# Patient Record
Sex: Female | Born: 2000 | Race: White | Hispanic: No | Marital: Single | Smoking: Never smoker
Health system: Southern US, Community
[De-identification: ages and names within clinical notes are randomized; demographics above are authoritative.]

## PROBLEM LIST (undated history)

## (undated) ENCOUNTER — Inpatient Hospital Stay: Payer: Self-pay

## (undated) DIAGNOSIS — J45909 Unspecified asthma, uncomplicated: Secondary | ICD-10-CM

## (undated) DIAGNOSIS — I10 Essential (primary) hypertension: Secondary | ICD-10-CM

## (undated) HISTORY — PX: NO PAST SURGERIES: SHX2092

## (undated) HISTORY — DX: Unspecified asthma, uncomplicated: J45.909

---
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
## 2021-02-11 ENCOUNTER — Other Ambulatory Visit: Payer: Self-pay

## 2021-02-11 ENCOUNTER — Emergency Department
Admission: EM | Admit: 2021-02-11 | Discharge: 2021-02-11 | Disposition: A | Discharge status: Home / Self Care | Payer: Medicaid Other | Attending: Emergency Medicine | Admitting: Emergency Medicine

## 2021-02-11 DIAGNOSIS — L0501 Pilonidal cyst with abscess: Secondary | ICD-10-CM | POA: Diagnosis not present

## 2021-02-11 DIAGNOSIS — S2249XA Multiple fractures of ribs, unspecified side, initial encounter for closed fracture: Secondary | ICD-10-CM

## 2021-02-11 DIAGNOSIS — W19XXXA Unspecified fall, initial encounter: Secondary | ICD-10-CM | POA: Diagnosis not present

## 2021-02-11 DIAGNOSIS — M533 Sacrococcygeal disorders, not elsewhere classified: Secondary | ICD-10-CM | POA: Diagnosis present

## 2021-02-11 MED ORDER — NAPROXEN 375 MG PO TABS: 375.0000 mg | ORAL_TABLET | Freq: Two times a day (BID) | ORAL | 0 refills | Status: DC

## 2021-02-11 MED ORDER — HYDROMORPHONE HCL 1 MG/ML IJ SOLN
1.0000 mg | Freq: Once | INTRAMUSCULAR | Status: AC
  Administered 2021-02-11: 1 mg via INTRAMUSCULAR
  Filled 2021-02-11: qty 1

## 2021-02-11 MED ORDER — SULFAMETHOXAZOLE-TRIMETHOPRIM 800-160 MG PO TABS: 1.0000 | ORAL_TABLET | Freq: Two times a day (BID) | ORAL | 0 refills | Status: DC

## 2021-02-11 MED ORDER — OXYCODONE-ACETAMINOPHEN 7.5-325 MG PO TABS: 1.0000 | ORAL_TABLET | Freq: Four times a day (QID) | ORAL | 0 refills | Status: DC | PRN

## 2021-02-11 MED ORDER — ONDANSETRON 8 MG PO TBDP
8.0000 mg | ORAL_TABLET | Freq: Once | ORAL | Status: AC
  Administered 2021-02-11: 8 mg via ORAL
  Filled 2021-02-11: qty 1

## 2021-02-11 MED ORDER — LIDOCAINE HCL (PF) 1 % IJ SOLN
5.0000 mL | Freq: Once | INTRAMUSCULAR | Status: AC
  Administered 2021-02-11: 5 mL
  Filled 2021-02-11: qty 5

## 2021-02-11 NOTE — Discharge Instructions (Addendum)
Read and follow discharge care instructions.  Take medication as directed.  Return back in 2 days to have packing material removed.  Be advised pain medication may cause drowsiness.

## 2021-02-11 NOTE — ED Notes (Signed)
See triage note  Presents s/p fall  States she landed on tailbone  Ambulates slowly d/t pain

## 2021-02-11 NOTE — ED Triage Notes (Signed)
Pt comes with c/o back and tailbone pain following a mechanical fall.

## 2021-02-11 NOTE — ED Provider Notes (Signed)
[facility] Emergency Department Provider Note   ____________________________________________   Event Date/Time   First MD Initiated Contact with Patient 02/11/21 1058     (approximate)  I have reviewed the triage vital signs and the nursing notes.   HISTORY  Chief Complaint Back Pain    HPI Kathleen Wong is a 20 y.o. female patient presents with 1 week of tailbone pain which increased after mechanical fall 2 days ago.  Patient denies radicular component to her pain.  Patient has bladder bowel dysfunction.  Patient stated feel like there is a "lump" between her buttocks.  Rates the pain as 8/10.  Described pain as "sore".  No palliative measure for complaint.         History reviewed. No pertinent past medical history.  There are no problems to display for this patient.   History reviewed. No pertinent surgical history.  Prior to Admission medications   Medication Sig Start Date End Date Taking? Authorizing Provider  naproxen (NAPROSYN) 375 MG tablet Take 1 tablet (375 mg total) by mouth 2 (two) times daily with a meal. 02/11/21  Yes Joni Reining, PA-C  oxyCODONE-acetaminophen (PERCOCET) 7.5-325 MG tablet Take 1 tablet by mouth every 6 (six) hours as needed for severe pain. 02/11/21  Yes Joni Reining, PA-C  sulfamethoxazole-trimethoprim (BACTRIM DS) 800-160 MG tablet Take 1 tablet by mouth 2 (two) times daily. 02/11/21  Yes Joni Reining, PA-C    Allergies Patient has no known allergies.  No family history on file.  Social History Social History   Tobacco Use  . Smoking status: Never Smoker  . Smokeless tobacco: Never Used  Vaping Use  . Vaping Use: Never used  Substance Use Topics  . Alcohol use: No  . Drug use: No    Review of Systems Constitutional: No fever/chills Eyes: No visual changes. ENT: No sore throat. Cardiovascular: Denies chest pain. Respiratory: Denies shortness of breath. Gastrointestinal: No abdominal  pain.  No nausea, no vomiting.  No diarrhea.  No constipation. Genitourinary: Negative for dysuria. Musculoskeletal: Negative for back pain. Skin: Negative for rash.  Nodule lesion between buttocks. Neurological: Negative for headaches, focal weakness or numbness.   ____________________________________________   PHYSICAL EXAM:  VITAL SIGNS: ED Triage Vitals  Enc Vitals Group     BP 02/11/21 1046 137/62     Pulse Rate 02/11/21 1046 74     Resp 02/11/21 1046 16     Temp 02/11/21 1046 98.3 F (36.8 C)     Temp Source 02/11/21 1046 Oral     SpO2 02/11/21 1046 100 %     Weight 02/11/21 1029 135 lb (61.2 kg)     Height 02/11/21 1029 5\' 5"  (1.651 m)     Head Circumference --      Peak Flow --      Pain Score 02/11/21 1029 8     Pain Loc --      Pain Edu? --      Excl. in GC? --    Constitutional: Alert and oriented. Well appearing and in no acute distress.  Anxious Cardiovascular: Normal rate, regular rhythm. Grossly normal heart sounds.  Good peripheral circulation. Respiratory: Normal respiratory effort.  No retractions. Lungs CTAB. Genitourinary: Deferred Musculoskeletal: No lower extremity tenderness nor edema.  No joint effusions. Neurologic:  Normal speech and language. No gross focal neurologic deficits are appreciated. No gait instability. Skin: Fluctuant nodule lesion superior aspect of buttocks  psychiatric: Mood and affect are normal.  Speech and behavior are normal.  ____________________________________________   LABS (all labs ordered are listed, but only abnormal results are displayed)  Labs Reviewed - No data to display ____________________________________________  EKG   ____________________________________________  RADIOLOGY I, Joni Reining, personally viewed and evaluated these images (plain radiographs) as part of my medical decision making, as well as reviewing the written report by the radiologist.  ED MD interpretation:  Official radiology  report(s): No results found.  ____________________________________________   PROCEDURES  Procedure(s) performed (including Critical Care):  Marland KitchenMarland KitchenIncision and Drainage  Date/Time: 02/11/2021 11:53 AM Performed by: Joni Reining, PA-C Authorized by: Joni Reining, PA-C   Consent:    Consent obtained:  Verbal   Consent given by:  Patient and parent   Risks, benefits, and alternatives were discussed: yes     Risks discussed:  Bleeding, incomplete drainage, pain and infection Universal protocol:    Procedure explained and questions answered to patient or proxy's satisfaction: yes     Relevant documents present and verified: yes     Patient identity confirmed:  Verbally with patient Anesthesia:    Anesthesia method:  Local infiltration   Local anesthetic:  Lidocaine 1% w/o epi Procedure type:    Complexity:  Complex Procedure details:    Incision types:  Stab incision and single with marsupialization   Incision depth:  Dermal   Wound management:  Probed and deloculated and irrigated with saline   Drainage:  Purulent   Drainage amount:  Copious   Wound treatment:  Drain placed   Packing materials:  1/4 in iodoform gauze   Amount 1/4" iodoform:  10 cm Post-procedure details:    Procedure completion:  Tolerated well, no immediate complications     ____________________________________________   INITIAL IMPRESSION / ASSESSMENT AND PLAN / ED COURSE  As part of my medical decision making, I reviewed the following data within the electronic MEDICAL RECORD NUMBER         Patient presents with buttocks pain for nodule lesion superior aspect between the buttocks.  Patient complaint physical exam consistent with pilonidal cyst.  See procedure note for incision and drainage.  Patient given discharge care instruction advised take medication as directed.  Patient advised follow-up in 2 days for wound check.      ____________________________________________   FINAL CLINICAL  IMPRESSION(S) / ED DIAGNOSES  Final diagnoses:  Pilonidal cyst with abscess     ED Discharge Orders         Ordered    oxyCODONE-acetaminophen (PERCOCET) 7.5-325 MG tablet  Every 6 hours PRN        02/11/21 1149    sulfamethoxazole-trimethoprim (BACTRIM DS) 800-160 MG tablet  2 times daily        02/11/21 1149    naproxen (NAPROSYN) 375 MG tablet  2 times daily with meals        02/11/21 1149          *Please note:  Kathleen Wong was evaluated in Emergency Department on 02/11/2021 for the symptoms described in the history of present illness. She was evaluated in the context of the global COVID-19 pandemic, which necessitated consideration that the patient might be at risk for infection with the SARS-CoV-2 virus that causes COVID-19. Institutional protocols and algorithms that pertain to the evaluation of patients at risk for COVID-19 are in a state of rapid change based on information released by regulatory bodies including the CDC and federal and state organizations. These policies and algorithms were followed during  the patient's care in the ED.  Some ED evaluations and interventions may be delayed as a result of limited staffing during and the pandemic.*   Note:  This document was prepared using Dragon voice recognition software and may include unintentional dictation errors.    Joni Reining, PA-C 02/11/21 1158    Merwyn Katos, MD 02/11/21 [phone]

## 2021-02-13 ENCOUNTER — Encounter: Payer: Self-pay | Admitting: Emergency Medicine

## 2021-02-13 ENCOUNTER — Other Ambulatory Visit: Payer: Self-pay

## 2021-02-13 ENCOUNTER — Emergency Department
Admission: EM | Admit: 2021-02-13 | Discharge: 2021-02-13 | Disposition: A | Discharge status: Home / Self Care | Payer: Medicaid Other | Attending: Emergency Medicine | Admitting: Emergency Medicine

## 2021-02-13 DIAGNOSIS — Z4801 Encounter for change or removal of surgical wound dressing: Secondary | ICD-10-CM | POA: Diagnosis not present

## 2021-02-13 DIAGNOSIS — Z5189 Encounter for other specified aftercare: Secondary | ICD-10-CM

## 2021-02-13 NOTE — ED Triage Notes (Signed)
Was seen 2 days ago for pilonidal cyst   States she is here for packing removal

## 2021-02-13 NOTE — Discharge Instructions (Signed)
Follow discharge care instruction continue previous medication.  You may now take sitz baths.

## 2021-02-13 NOTE — ED Provider Notes (Signed)
[facility] Emergency Department Provider Note   ____________________________________________   Event Date/Time   First MD Initiated Contact with Patient 02/13/21 1302     (approximate)  I have reviewed the triage vital signs and the nursing notes.   HISTORY  Chief Complaint Wound Check (/)    HPI Kathleen Wong is a 20 y.o. female patient follow-up status post incision and drainage pilonidal cyst 2 days ago.  Patient state feeling better.  Patient denies able to sit without discomfort.  Patient has purchased a doughnut cushion as directed.         History reviewed. No pertinent past medical history.  There are no problems to display for this patient.   History reviewed. No pertinent surgical history.  Prior to Admission medications   Medication Sig Start Date End Date Taking? Authorizing Provider  naproxen (NAPROSYN) 375 MG tablet Take 1 tablet (375 mg total) by mouth 2 (two) times daily with a meal. 02/11/21   Joni Reining, PA-C  oxyCODONE-acetaminophen (PERCOCET) 7.5-325 MG tablet Take 1 tablet by mouth every 6 (six) hours as needed for severe pain. 02/11/21   Joni Reining, PA-C  sulfamethoxazole-trimethoprim (BACTRIM DS) 800-160 MG tablet Take 1 tablet by mouth 2 (two) times daily. 02/11/21   Joni Reining, PA-C    Allergies Patient has no known allergies.  No family history on file.  Social History Social History   Tobacco Use   Smoking status: Never Smoker   Smokeless tobacco: Never Used  Building services engineer Use: Never used  Substance Use Topics   Alcohol use: No   Drug use: No    Review of Systems Constitutional: No fever/chills Eyes: No visual changes. ENT: No sore throat. Cardiovascular: Denies chest pain. Respiratory: Denies shortness of breath. Gastrointestinal: No abdominal pain.  No nausea, no vomiting.  No diarrhea.  No constipation. Genitourinary: Negative for dysuria. Musculoskeletal: Negative for  back pain. Skin: Negative for rash.  Pilonidal cyst Neurological: Negative for headaches, focal weakness or numbness.   ____________________________________________   PHYSICAL EXAM:  VITAL SIGNS: ED Triage Vitals  Enc Vitals Group     BP 02/13/21 1241 130/78     Pulse Rate 02/13/21 1241 78     Resp 02/13/21 1241 18     Temp 02/13/21 1241 98.4 F (36.9 C)     Temp Source 02/13/21 1241 Oral     SpO2 02/13/21 1241 99 %     Weight 02/13/21 1242 134 lb 14.7 oz (61.2 kg)     Height 02/13/21 1242 5\' 5"  (1.651 m)     Head Circumference --      Peak Flow --      Pain Score 02/13/21 1242 2     Pain Loc --      Pain Edu? --      Excl. in GC? --     Constitutional: Alert and oriented. Well appearing and in no acute distress. Cardiovascular: Normal rate, regular rhythm. Grossly normal heart sounds.  Good peripheral circulation. Respiratory: Normal respiratory effort.  No retractions. Lungs CTAB. Genitourinary: Deferred Musculoskeletal: No lower extremity tenderness nor edema.  No joint effusions. Neurologic:  Normal speech and language. No gross focal neurologic deficits are appreciated. No gait instability. Skin: Slight drainage from incision site.  Psychiatric: Mood and affect are normal. Speech and behavior are normal.  ____________________________________________   LABS (all labs ordered are listed, but only abnormal results are displayed)  Labs Reviewed - No data  to display ____________________________________________  EKG   ____________________________________________  RADIOLOGY I, Joni Reining, personally viewed and evaluated these images (plain radiographs) as part of my medical decision making, as well as reviewing the written report by the radiologist.  ED MD interpretation:    Official radiology report(s): No results found.  ____________________________________________   PROCEDURES  Procedure(s) performed (including Critical  Care):  Procedures   ____________________________________________   INITIAL IMPRESSION / ASSESSMENT AND PLAN / ED COURSE  As part of my medical decision making, I reviewed the following data within the electronic MEDICAL RECORD NUMBER         Patient presents for wound check status post incision and drainage of a pilonidal cyst.  Packing material was removed with minimal drainage.  Wound was irrigated with clear return.  Area was clean and redressed.  Patient advised continue previous medications and follow discharge care instruction.  Return to ED if condition worsens.      ____________________________________________   FINAL CLINICAL IMPRESSION(S) / ED DIAGNOSES  Final diagnoses:  Wound check, abscess     ED Discharge Orders    None      *Please note:  Kathleen Wong was evaluated in Emergency Department on 02/13/2021 for the symptoms described in the history of present illness. She was evaluated in the context of the global COVID-19 pandemic, which necessitated consideration that the patient might be at risk for infection with the SARS-CoV-2 virus that causes COVID-19. Institutional protocols and algorithms that pertain to the evaluation of patients at risk for COVID-19 are in a state of rapid change based on information released by regulatory bodies including the CDC and federal and state organizations. These policies and algorithms were followed during the patient's care in the ED.  Some ED evaluations and interventions may be delayed as a result of limited staffing during and the pandemic.*   Note:  This document was prepared using Dragon voice recognition software and may include unintentional dictation errors.    Joni Reining, PA-C 02/13/21 1316    Concha Se, MD 02/14/21 [phone]

## 2021-02-14 ENCOUNTER — Ambulatory Visit: Payer: Self-pay

## 2023-06-01 ENCOUNTER — Other Ambulatory Visit: Payer: Self-pay

## 2023-06-01 ENCOUNTER — Emergency Department
Admission: EM | Admit: 2023-06-01 | Discharge: 2023-06-01 | Disposition: A | Discharge status: Home / Self Care | Payer: Medicaid Other | Attending: Emergency Medicine | Admitting: Emergency Medicine

## 2023-06-01 DIAGNOSIS — Z3202 Encounter for pregnancy test, result negative: Secondary | ICD-10-CM | POA: Diagnosis not present

## 2023-06-01 DIAGNOSIS — Z3200 Encounter for pregnancy test, result unknown: Secondary | ICD-10-CM | POA: Diagnosis present

## 2023-06-01 LAB — URINALYSIS, ROUTINE W REFLEX MICROSCOPIC
Bilirubin Urine: NEGATIVE
Glucose, UA: NEGATIVE mg/dL
Hgb urine dipstick: NEGATIVE
Ketones, ur: NEGATIVE mg/dL
Nitrite: NEGATIVE
Protein, ur: 30 mg/dL — AB
Specific Gravity, Urine: 1.028 (ref 1.005–1.030)
pH: 6 (ref 5.0–8.0)

## 2023-06-01 LAB — POC URINE PREG, ED: Preg Test, Ur: NEGATIVE

## 2023-06-01 NOTE — ED Triage Notes (Signed)
Pt presents to ER with c/o nausea and very mild lower abd cramping that started a few days ago.  Pt states she is concerned she is pregnant at this time.  LMP 6/22, no prior pregnancies.  Pt denies vomiting, diarrhea, vag bleeding, or urinary sx.  Pt is otherwise A&O x4 and in NAD.

## 2023-06-01 NOTE — ED Provider Notes (Signed)
   [facility] Provider Note    Event Date/Time   First MD Initiated Contact with Patient 06/01/23 [phone]     (approximate)   History   Possible Pregnancy   HPI  Kathleen Wong is a 22 y.o. female who presents to the ED for evaluation of Possible Pregnancy   Patient presents to the ED requesting pregnancy test.  Reports she felt nauseous this morning, since resolved.  Reports that she is terrified she might be pregnant.  No other concerns.  Did not try an OTC pregnancy test.   Physical Exam   Triage Vital Signs: ED Triage Vitals  Enc Vitals Group     BP 06/01/23 0415 (!) 154/91     Pulse Rate 06/01/23 0415 78     Resp 06/01/23 0415 18     Temp 06/01/23 0415 (!) 97.5 F (36.4 C)     Temp Source 06/01/23 0415 Oral     SpO2 06/01/23 0415 100 %     Weight 06/01/23 0414 180 lb (81.6 kg)     Height 06/01/23 0414 5\' 5"  (1.651 m)     Head Circumference --      Peak Flow --      Pain Score 06/01/23 0423 3     Pain Loc --      Pain Edu? --      Excl. in GC? --     Most recent vital signs: Vitals:   06/01/23 0415  BP: (!) 154/91  Pulse: 78  Resp: 18  Temp: (!) 97.5 F (36.4 C)  SpO2: 100%    General: Awake, no distress.  CV:  Good peripheral perfusion.  Resp:  Normal effort.  Abd:  No distention.  Soft and benign MSK:  No deformity noted.  Neuro:  No focal deficits appreciated. Other:     ED Results / Procedures / Treatments   Labs (all labs ordered are listed, but only abnormal results are displayed) Labs Reviewed  URINALYSIS, ROUTINE W REFLEX MICROSCOPIC - Abnormal; Notable for the following components:      Result Value   Color, Urine YELLOW (*)    APPearance CLOUDY (*)    Protein, ur 30 (*)    Leukocytes,Ua SMALL (*)    Bacteria, UA RARE (*)    All other components within normal limits  POC URINE PREG, ED    EKG   RADIOLOGY   Official radiology report(s): No results found.  PROCEDURES and  INTERVENTIONS:  Procedures  Medications - No data to display   IMPRESSION / MDM / ASSESSMENT AND PLAN / ED COURSE  I reviewed the triage vital signs and the nursing notes.  Differential diagnosis includes, but is not limited to, pregnant, not pregnant, UTI  Patient presents for pregnancy test.  Not pregnant.  Benign exam and suitable for outpatient management.     FINAL CLINICAL IMPRESSION(S) / ED DIAGNOSES   Final diagnoses:  Pregnancy examination or test, negative result     Rx / DC Orders   ED Discharge Orders     None        Note:  This document was prepared using Dragon voice recognition software and may include unintentional dictation errors.   Delton Prairie, MD 06/01/23 [phone]

## 2023-08-09 ENCOUNTER — Ambulatory Visit (LOCAL_COMMUNITY_HEALTH_CENTER): Payer: Medicaid Other

## 2023-08-09 VITALS — BP 134/74 | Ht 65.0 in | Wt 226.0 lb

## 2023-08-09 DIAGNOSIS — Z3201 Encounter for pregnancy test, result positive: Secondary | ICD-10-CM

## 2023-08-09 DIAGNOSIS — Z3009 Encounter for other general counseling and advice on contraception: Secondary | ICD-10-CM

## 2023-08-09 LAB — PREGNANCY, URINE: Preg Test, Ur: POSITIVE — AB

## 2023-08-09 MED ORDER — PRENATAL 27-0.8 MG PO TABS: 1.0000 | ORAL_TABLET | Freq: Every day | ORAL | Status: AC

## 2023-08-09 NOTE — Progress Notes (Signed)
UPT positive. Plans prenatal care at [facility]. Has contacted them but no appt yet. Encouraged to establish prenatal care soon.  Positive preg packet given and reviewed.   The patient was dispensed prenatal vitamins #100 today per [facility] Dr Lorrin Mais. I provided counseling today regarding the medication. We discussed the medication, the side effects and when to call clinic. Patient given the opportunity to ask questions. Questions answered.    Sent to clerk for medicaid presumptive eligibility. Jerel Shepherd, RN

## 2023-08-17 ENCOUNTER — Ambulatory Visit (INDEPENDENT_AMBULATORY_CARE_PROVIDER_SITE_OTHER): Payer: Self-pay

## 2023-08-17 VITALS — Wt 226.0 lb

## 2023-08-17 DIAGNOSIS — Z369 Encounter for antenatal screening, unspecified: Secondary | ICD-10-CM

## 2023-08-17 DIAGNOSIS — Z3480 Encounter for supervision of other normal pregnancy, unspecified trimester: Secondary | ICD-10-CM | POA: Insufficient documentation

## 2023-08-17 DIAGNOSIS — O0990 Supervision of high risk pregnancy, unspecified, unspecified trimester: Secondary | ICD-10-CM | POA: Insufficient documentation

## 2023-08-17 DIAGNOSIS — Z3689 Encounter for other specified antenatal screening: Secondary | ICD-10-CM

## 2023-08-17 NOTE — Progress Notes (Signed)
Pt is scheduled °

## 2023-08-17 NOTE — Patient Instructions (Signed)
First Trimester of Pregnancy  The first trimester of pregnancy starts on the first day of your last menstrual period until the end of week 12. This is also called months 1 through 3 of pregnancy. Body changes during your first trimester Your body goes through many changes during pregnancy. The changes usually return to normal after your baby is born. Physical changes You may gain or lose weight. Your breasts may grow larger and hurt. The area around your nipples may get darker. Dark spots or blotches may develop on your face. You may have changes in your hair. Health changes You may feel like you might vomit (nauseous), and you may vomit. You may have heartburn. You may have headaches. You may have trouble pooping (constipation). Your gums may bleed. Other changes You may get tired easily. You may pee (urinate) more often. Your menstrual periods will stop. You may not feel hungry. You may want to eat certain kinds of food. You may have changes in your emotions from day to day. You may have more dreams. Follow these instructions at home: Medicines Take over-the-counter and prescription medicines only as told by your doctor. Some medicines are not safe during pregnancy. Take a prenatal vitamin that contains at least 600 micrograms (mcg) of folic acid. Eating and drinking Eat healthy meals that include: Fresh fruits and vegetables. Whole grains. Good sources of protein, such as meat, eggs, or tofu. Low-fat dairy products. Avoid raw meat and unpasteurized juice, milk, and cheese. If you feel like you may vomit, or you vomit: Eat 4 or 5 small meals a day instead of 3 large meals. Try eating a few soda crackers. Drink liquids between meals instead of during meals. You may need to take these actions to prevent or treat trouble pooping: Drink enough fluids to keep your pee (urine) pale yellow. Eat foods that are high in fiber. These include beans, whole grains, and fresh fruits and  vegetables. Limit foods that are high in fat and sugar. These include fried or sweet foods. Activity Exercise only as told by your doctor. Most people can do their usual exercise routine during pregnancy. Stop exercising if you have cramps or pain in your lower belly (abdomen) or low back. Do not exercise if it is too hot or too humid, or if you are in a place of great height (high altitude). Avoid heavy lifting. If you choose to, you may have sex unless your doctor tells you not to. Relieving pain and discomfort Wear a good support bra if your breasts are sore. Rest with your legs raised (elevated) if you have leg cramps or low back pain. If you have bulging veins (varicose veins) in your legs: Wear support hose as told by your doctor. Raise your feet for 15 minutes, 3-4 times a day. Limit salt in your food. Safety Wear your seat belt at all times when you are in a car. Talk with your doctor if someone is hurting you or yelling at you. Talk with your doctor if you are feeling sad or have thoughts of hurting yourself. Lifestyle Do not use hot tubs, steam rooms, or saunas. Do not douche. Do not use tampons or scented sanitary pads. Do not use herbal medicines, illegal drugs, or medicines that are not approved by your doctor. Do not drink alcohol. Do not smoke or use any products that contain nicotine or tobacco. If you need help quitting, ask your doctor. Avoid cat litter boxes and soil that is used by cats. These carry   germs that can cause harm to the baby and can cause a loss of your baby by miscarriage or stillbirth. General instructions Keep all follow-up visits. This is important. Ask for help if you need counseling or if you need help with nutrition. Your doctor can give you advice or tell you where to go for help. Visit your dentist. At home, brush your teeth with a soft toothbrush. Floss gently. Write down your questions. Take them to your prenatal visits. Where to find more  information American Pregnancy Association: americanpregnancy.org American College of Obstetricians and Gynecologists: www.acog.org Office on Women's Health: womenshealth.gov/pregnancy Contact a doctor if: You are dizzy. You have a fever. You have mild cramps or pressure in your lower belly. You have a nagging pain in your belly area. You continue to feel like you may vomit, you vomit, or you have watery poop (diarrhea) for 24 hours or longer. You have a bad-smelling fluid coming from your vagina. You have pain when you pee. You are exposed to a disease that spreads from person to person, such as chickenpox, measles, Zika virus, HIV, or hepatitis. Get help right away if: You have spotting or bleeding from your vagina. You have very bad belly cramping or pain. You have shortness of breath or chest pain. You have any kind of injury, such as from a fall or a car crash. You have new or increased pain, swelling, or redness in an arm or leg. Summary The first trimester of pregnancy starts on the first day of your last menstrual period until the end of week 12 (months 1 through 3). Eat 4 or 5 small meals a day instead of 3 large meals. Do not smoke or use any products that contain nicotine or tobacco. If you need help quitting, ask your doctor. Keep all follow-up visits. This information is not intended to replace advice given to you by your health care provider. Make sure you discuss any questions you have with your health care provider. Document Revised: 04/17/2020 Document Reviewed: 02/22/2020 Elsevier Patient Education  2024 Elsevier Inc. Commonly Asked Questions During Pregnancy  Cats: A parasite can be excreted in cat feces.  To avoid exposure you need to have another person empty the little box.  If you must empty the litter box you will need to wear gloves.  Wash your hands after handling your cat.  This parasite can also be found in raw or undercooked meat so this should also be  avoided.  Colds, Sore Throats, Flu: Please check your medication sheet to see what you can take for symptoms.  If your symptoms are unrelieved by these medications please call the office.  Dental Work: Most any dental work your dentist recommends is permitted.  X-rays should only be taken during the first trimester if absolutely necessary.  Your abdomen should be shielded with a lead apron during all x-rays.  Please notify your provider prior to receiving any x-rays.  Novocaine is fine; gas is not recommended.  If your dentist requires a note from us prior to dental work please call the office and we will provide one for you.  Exercise: Exercise is an important part of staying healthy during your pregnancy.  You may continue most exercises you were accustomed to prior to pregnancy.  Later in your pregnancy you will most likely notice you have difficulty with activities requiring balance like riding a bicycle.  It is important that you listen to your body and avoid activities that put you at a higher   risk of falling.  Adequate rest and staying well hydrated are a must!  If you have questions about the safety of specific activities ask your provider.    Exposure to Children with illness: Try to avoid obvious exposure; report any symptoms to us when noted,  If you have chicken pos, red measles or mumps, you should be immune to these diseases.   Please do not take any vaccines while pregnant unless you have checked with your OB provider.  Fetal Movement: After 28 weeks we recommend you do "kick counts" twice daily.  Lie or sit down in a calm quiet environment and count your baby movements "kicks".  You should feel your baby at least 10 times per hour.  If you have not felt 10 kicks within the first hour get up, walk around and have something sweet to eat or drink then repeat for an additional hour.  If count remains less than 10 per hour notify your provider.  Fumigating: Follow your pest control agent's  advice as to how long to stay out of your home.  Ventilate the area well before re-entering.  Hemorrhoids:   Most over-the-counter preparations can be used during pregnancy.  Check your medication to see what is safe to use.  It is important to use a stool softener or fiber in your diet and to drink lots of liquids.  If hemorrhoids seem to be getting worse please call the office.   Hot Tubs:  Hot tubs Jacuzzis and saunas are not recommended while pregnant.  These increase your internal body temperature and should be avoided.  Intercourse:  Sexual intercourse is safe during pregnancy as long as you are comfortable, unless otherwise advised by your provider.  Spotting may occur after intercourse; report any bright red bleeding that is heavier than spotting.  Labor:  If you know that you are in labor, please go to the hospital.  If you are unsure, please call the office and let us help you decide what to do.  Lifting, straining, etc:  If your job requires heavy lifting or straining please check with your provider for any limitations.  Generally, you should not lift items heavier than that you can lift simply with your hands and arms (no back muscles)  Painting:  Paint fumes do not harm your pregnancy, but may make you ill and should be avoided if possible.  Latex or water based paints have less odor than oils.  Use adequate ventilation while painting.  Permanents & Hair Color:  Chemicals in hair dyes are not recommended as they cause increase hair dryness which can increase hair loss during pregnancy.  " Highlighting" and permanents are allowed.  Dye may be absorbed differently and permanents may not hold as well during pregnancy.  Sunbathing:  Use a sunscreen, as skin burns easily during pregnancy.  Drink plenty of fluids; avoid over heating.  Tanning Beds:  Because their possible side effects are still unknown, tanning beds are not recommended.  Ultrasound Scans:  Routine ultrasounds are performed  at approximately 20 weeks.  You will be able to see your baby's general anatomy an if you would like to know the gender this can usually be determined as well.  If it is questionable when you conceived you may also receive an ultrasound early in your pregnancy for dating purposes.  Otherwise ultrasound exams are not routinely performed unless there is a medical necessity.  Although you can request a scan we ask that you pay for it when   conducted because insurance does not cover " patient request" scans.  Work: If your pregnancy proceeds without complications you may work until your due date, unless your physician or employer advises otherwise.  Round Ligament Pain/Pelvic Discomfort:  Sharp, shooting pains not associated with bleeding are fairly common, usually occurring in the second trimester of pregnancy.  They tend to be worse when standing up or when you remain standing for long periods of time.  These are the result of pressure of certain pelvic ligaments called "round ligaments".  Rest, Tylenol and heat seem to be the most effective relief.  As the womb and fetus grow, they rise out of the pelvis and the discomfort improves.  Please notify the office if your pain seems different than that described.  It may represent a more serious condition.  Common Medications Safe in Pregnancy  Acne:      Constipation:  Benzoyl Peroxide     Colace  Clindamycin      Dulcolax Suppository  Topica Erythromycin     Fibercon  Salicylic Acid      Metamucil         Miralax AVOID:        Senakot   Accutane    Cough:  Retin-A       Cough Drops  Tetracycline      Phenergan w/ Codeine if Rx  Minocycline      Robitussin (Plain & DM)  Antibiotics:     Crabs/Lice:  Ceclor       RID  Cephalosporins    AVOID:  E-Mycins      Kwell  Keflex  Macrobid/Macrodantin   Diarrhea:  Penicillin      Kao-Pectate  Zithromax      Imodium AD         PUSH FLUIDS AVOID:       Cipro     Fever:  Tetracycline      Tylenol (Regular  or Extra  Minocycline       Strength)  Levaquin      Extra Strength-Do not          Exceed 8 tabs/24 hrs Caffeine:        <200mg/day (equiv. To 1 cup of coffee or  approx. 3 12 oz sodas)         Gas: Cold/Hayfever:       Gas-X  Benadryl      Mylicon  Claritin       Phazyme  **Claritin-D        Chlor-Trimeton    Headaches:  Dimetapp      ASA-Free Excedrin  Drixoral-Non-Drowsy     Cold Compress  Mucinex (Guaifenasin)     Tylenol (Regular or Extra  Sudafed/Sudafed-12 Hour     Strength)  **Sudafed PE Pseudoephedrine   Tylenol Cold & Sinus     Vicks Vapor Rub  Zyrtec  **AVOID if Problems With Blood Pressure         Heartburn: Avoid lying down for at least 1 hour after meals  Aciphex      Maalox     Rash:  Milk of Magnesia     Benadryl    Mylanta       1% Hydrocortisone Cream  Pepcid  Pepcid Complete   Sleep Aids:  Prevacid      Ambien   Prilosec       Benadryl  Rolaids       Chamomile Tea  Tums (Limit 4/day)     Unisom           Tylenol PM         Warm milk-add vanilla or  Hemorrhoids:       Sugar for taste  Anusol/Anusol H.C.  (RX: Analapram 2.5%)  Sugar Substitutes:  Hydrocortisone OTC     Ok in moderation  Preparation H      Tucks        Vaseline lotion applied to tissue with wiping    Herpes:     Throat:  Acyclovir      Oragel  Famvir  Valtrex     Vaccines:         Flu Shot Leg Cramps:       *Gardasil  Benadryl      Hepatitis A         Hepatitis B Nasal Spray:       Pneumovax  Saline Nasal Spray     Polio Booster         Tetanus Nausea:       Tuberculosis test or PPD  Vitamin B6 25 mg TID   AVOID:    Dramamine      *Gardasil  Emetrol       Live Poliovirus  Ginger Root 250 mg QID    MMR (measles, mumps &  High Complex Carbs @ Bedtime    rebella)  Sea Bands-Accupressure    Varicella (Chickenpox)  Unisom 1/2 tab TID     *No known complications           If received before Pain:         Known pregnancy;   Darvocet       Resume series  after  Lortab        Delivery  Percocet    Yeast:   Tramadol      Femstat  Tylenol 3      Gyne-lotrimin  Ultram       Monistat  Vicodin           MISC:         All Sunscreens           Hair Coloring/highlights          Insect Repellant's          (Including DEET)         Mystic Tans  

## 2023-08-17 NOTE — Progress Notes (Addendum)
New OB Intake  I connected with  Flint Melter on 08/17/23 at  9:15 AM EDT by telephone Video Visit and verified that I am speaking with the correct person using two identifiers. Nurse is located at [facility] and pt is located at home.  I explained I am completing New OB Intake today. We discussed her EDD of 03/30/2024 that is based on LMP of 06/24/2023. Pt is G1/P0. I reviewed her allergies, medications, Medical/Surgical/OB history, and appropriate screenings. There are cats in the home. Pt states when she cleans litter box she covers her nose.  Adv to not clean the litter    Based on history, this is a/an pregnancy uncomplicated .   Patient Active Problem List   Diagnosis Date Noted   Supervision of other normal pregnancy, antepartum 08/17/2023    Concerns addressed today None   Delivery Plans:  Plans to deliver at [facility]. Pt isn't sure; pt aware [facility] is the only hospital our providers deliver at; if she goes somewhere else she will have someone deliver her that she hasn't seen before.  Anatomy US Explained first scheduled [place] will be scheduled soon and an anatomy scan will be done at 20 weeks.    Labs Discussed Avelina Laine genetic screening with patient. Patient generic genetic testing to be drawn at new OB visit. Discussed possible labs to be drawn at new OB appointment.  COVID Vaccine Patient has not had COVID vaccine.   Social Determinants of Health Food Insecurity: denies food insecurity Transportation: Patient denies transportation needs.  First visit review I reviewed new OB appt with pt. I explained she will have ob bloodwork and pap smear/pelvic exam if indicated. Explained pt will be seen by Guadlupe Spanish, LP at first visit; encounter routed to appropriate provider.   Loran Senters, [facility] 08/17/2023  9:49 AM

## 2023-08-18 NOTE — Progress Notes (Signed)
Patient has been scheduled for 08/27/2023 for a dating scan  cj

## 2023-08-27 ENCOUNTER — Ambulatory Visit (INDEPENDENT_AMBULATORY_CARE_PROVIDER_SITE_OTHER): Payer: Medicaid Other

## 2023-08-27 DIAGNOSIS — Z3A01 Less than 8 weeks gestation of pregnancy: Secondary | ICD-10-CM | POA: Diagnosis not present

## 2023-08-27 DIAGNOSIS — Z3687 Encounter for antenatal screening for uncertain dates: Secondary | ICD-10-CM | POA: Diagnosis not present

## 2023-08-27 DIAGNOSIS — Z3480 Encounter for supervision of other normal pregnancy, unspecified trimester: Secondary | ICD-10-CM

## 2023-08-27 DIAGNOSIS — Z369 Encounter for antenatal screening, unspecified: Secondary | ICD-10-CM

## 2023-09-11 ENCOUNTER — Other Ambulatory Visit: Payer: Self-pay | Admitting: Obstetrics

## 2023-09-11 ENCOUNTER — Encounter: Payer: Self-pay | Admitting: Obstetrics

## 2023-09-11 NOTE — Progress Notes (Signed)
EDD changed to 04/16/24 per [place] 08/27/23 that shows [redacted]w[redacted]d IUP.  Glenetta Borg, CNM

## 2023-10-08 ENCOUNTER — Ambulatory Visit (INDEPENDENT_AMBULATORY_CARE_PROVIDER_SITE_OTHER): Payer: Medicaid Other | Admitting: Obstetrics

## 2023-10-08 ENCOUNTER — Encounter: Payer: Self-pay | Admitting: Obstetrics

## 2023-10-08 ENCOUNTER — Other Ambulatory Visit (HOSPITAL_COMMUNITY)
Admission: RE | Admit: 2023-10-08 | Discharge: 2023-10-08 | Disposition: A | Discharge status: Home / Self Care | Payer: Medicaid Other | Source: Ambulatory Visit | Attending: Certified Nurse Midwife | Admitting: Certified Nurse Midwife

## 2023-10-08 VITALS — BP 143/88 | HR 88 | Wt 228.0 lb

## 2023-10-08 DIAGNOSIS — Z124 Encounter for screening for malignant neoplasm of cervix: Secondary | ICD-10-CM | POA: Insufficient documentation

## 2023-10-08 DIAGNOSIS — Z3A12 12 weeks gestation of pregnancy: Secondary | ICD-10-CM | POA: Diagnosis not present

## 2023-10-08 DIAGNOSIS — Z1379 Encounter for other screening for genetic and chromosomal anomalies: Secondary | ICD-10-CM

## 2023-10-08 DIAGNOSIS — Z3401 Encounter for supervision of normal first pregnancy, first trimester: Secondary | ICD-10-CM | POA: Diagnosis not present

## 2023-10-08 DIAGNOSIS — Z0283 Encounter for blood-alcohol and blood-drug test: Secondary | ICD-10-CM

## 2023-10-08 DIAGNOSIS — O161 Unspecified maternal hypertension, first trimester: Secondary | ICD-10-CM

## 2023-10-08 DIAGNOSIS — Z113 Encounter for screening for infections with a predominantly sexual mode of transmission: Secondary | ICD-10-CM

## 2023-10-08 DIAGNOSIS — R030 Elevated blood-pressure reading, without diagnosis of hypertension: Secondary | ICD-10-CM | POA: Insufficient documentation

## 2023-10-08 LAB — OB RESULTS CONSOLE VARICELLA ZOSTER ANTIBODY, IGG: Varicella: NON-IMMUNE/NOT IMMUNE

## 2023-10-08 MED ORDER — ASPIRIN 81 MG PO TBEC: 162.0000 mg | DELAYED_RELEASE_TABLET | Freq: Every day | ORAL | 12 refills | Status: DC

## 2023-10-08 NOTE — Progress Notes (Signed)
NEW OB HISTORY AND PHYSICAL  SUBJECTIVE:       Kathleen Wong is a 22 y.o. G31P0000 female, Patient's last menstrual period was 06/24/2023 (within days)., Estimated Date of Delivery: 04/16/24, [redacted]w[redacted]d, presents today for establishment of Prenatal Care. She reports occasional nausea and headaches that have resolved. The father of the  baby had a sister who died of SIDS. His mother reports that there have been 5 SIDS deaths in their family.   Social history Partner/Relationship: Evangeline Gula, involved Living situation: Lives with Onalee Hua and his family (mother, brother, 11-year-old daughter) Work: [employer] Exercise: none outside of work Substance use: quit vaping when she found out she was pregnant; denies other substance use   Gynecologic History Patient's last menstrual period was 06/24/2023 (within days).  Last period was lighter than normal Contraception: none Last Pap: Never.   Obstetric History OB History  Gravida Para Term Preterm AB Living  1 0 0 0 0 0  SAB IAB Ectopic Multiple Live Births  0 0 0 0 0    # Outcome Date GA Lbr Len/2nd Weight Sex Type Anes PTL Lv  1 Current             Past Medical History:  Diagnosis Date   Asthma    as a child    Past Surgical History:  Procedure Laterality Date   NO PAST SURGERIES      Current Outpatient Medications on File Prior to Visit  Medication Sig Dispense Refill   Prenatal Vit-Fe Fumarate-FA (MULTIVITAMIN-PRENATAL) 27-0.8 MG TABS tablet Take 1 tablet by mouth daily at 12 noon.     No current facility-administered medications on file prior to visit.    No Known Allergies  Social History   Socioeconomic History   Marital status: Single    Spouse name: Not on file   Number of children: 0   Years of education: 13   Highest education level: Not on file  Occupational History   Occupation: unemployed  Tobacco Use   Smoking status: Never   Smokeless tobacco: Never  Vaping Use   Vaping status: Former   Quit date:  07/19/2023  Substance and Sexual Activity   Alcohol use: Not Currently   Drug use: Never   Sexual activity: Yes    Partners: Male    Birth control/protection: None  Other Topics Concern   Not on file  Social History Narrative   Not on file   Social Determinants of Health   Financial Resource Strain: Low Risk  (08/17/2023)   Overall Financial Resource Strain (CARDIA)    Difficulty of Paying Living Expenses: Not very hard  Food Insecurity: No Food Insecurity (08/17/2023)   Hunger Vital Sign    Worried About Running Out of Food in the Last Year: Never true    Ran Out of Food in the Last Year: Never true  Transportation Needs: No Transportation Needs (08/17/2023)   PRAPARE - Administrator, Civil Service (Medical): No    Lack of Transportation (Non-Medical): No  Physical Activity: Inactive (08/17/2023)   Exercise Vital Sign    Days of Exercise per Week: 0 days    Minutes of Exercise per Session: 0 min  Stress: No Stress Concern Present (08/17/2023)   [employer] of Occupational Health - Occupational Stress Questionnaire    Feeling of Stress : Not at all  Social Connections: Moderately Isolated (08/17/2023)   Social Connection and Isolation Panel [NHANES]    Frequency of Communication with Friends and  Family: More than three times a week    Frequency of Social Gatherings with Friends and Family: Twice a week    Attends Religious Services: Never    Database administrator or Organizations: No    Attends Banker Meetings: Never    Marital Status: Living with partner  Intimate Partner Violence: Not At Risk (08/17/2023)   Humiliation, Afraid, Rape, and Kick questionnaire    Fear of Current or Ex-Partner: No    Emotionally Abused: No    Physically Abused: No    Sexually Abused: No    Family History  Problem Relation Age of Onset   Healthy Mother    Healthy Father    Healthy Sister    Healthy Sister    Healthy Brother    Healthy Maternal Grandmother     Healthy Maternal Grandfather    Heart attack Paternal Grandmother    Healthy Paternal Grandfather    Autism Maternal Uncle     The following portions of the patient's history were reviewed and updated as appropriate: allergies, current medications, past OB history, past medical history, past surgical history, past family history, past social history, and problem list.  Constitutional: Denied constitutional symptoms, night sweats, recent illness, fatigue, fever, insomnia and weight loss.  Eyes: Denied eye symptoms, eye pain, photophobia, vision change and visual disturbance.  Ears/Nose/Throat/Neck: Denied ear, nose, throat or neck symptoms, hearing loss, nasal discharge, sinus congestion and sore throat.  Cardiovascular: Denied cardiovascular symptoms, arrhythmia, chest pain/pressure, edema, exercise intolerance, orthopnea and palpitations.  Respiratory: Denied pulmonary symptoms, asthma, pleuritic pain, productive sputum, cough, dyspnea and wheezing.  Gastrointestinal: Denied, gastro-esophageal reflux, melena, nausea and vomiting.  Genitourinary: Denied genitourinary symptoms including symptomatic vaginal discharge, pelvic relaxation issues, and urinary complaints.  Musculoskeletal: Denied musculoskeletal symptoms, stiffness, swelling, muscle weakness and myalgia.  Dermatologic: Denied dermatology symptoms, rash and scar.  Neurologic: Denied neurology symptoms, dizziness, headache, neck pain and syncope.  Psychiatric: Denied psychiatric symptoms, anxiety and depression.  Endocrine: Denied endocrine symptoms including hot flashes and night sweats.    Indications for ASA therapy (per uptodate) One of the following: Previous pregnancy with preeclampsia, especially early onset and with an adverse outcome No Multifetal gestation No Chronic hypertension No Type 1 or 2 diabetes mellitus No Chronic kidney disease No Autoimmune disease (antiphospholipid syndrome, systemic lupus erythematosus)  No  Two or more of the following: Nulliparity Yes Obesity (body mass index >30 kg/m2) Yes Family history of preeclampsia in mother or sister Yes Age >=35 years No Sociodemographic characteristics (African American race, low socioeconomic level) No Personal risk factors (eg, previous pregnancy with low birth weight or small for gestational age infant, previous adverse pregnancy outcome [eg, stillbirth], interval >10 years between pregnancies) No   OBJECTIVE: Initial Physical Exam (New OB)  GENERAL APPEARANCE: alert, well appearing HEAD: normocephalic, atraumatic MOUTH: mucous membranes moist, pharynx normal without lesions THYROID: no thyromegaly or masses present BREASTS: no masses noted, no significant tenderness, no palpable axillary nodes, no skin changes LUNGS: clear to auscultation, no wheezes, rales or rhonchi, symmetric air entry HEART: regular rate and rhythm, no murmurs ABDOMEN: soft, nontender, nondistended, no abnormal masses, no epigastric pain, FHT present, and fundus soft, non-tender, palpable above SP EXTREMITIES: no redness or tenderness in the calves or thighs SKIN: normal coloration and turgor, no rashes LYMPH NODES: no adenopathy palpable NEUROLOGIC: alert, oriented, normal speech, no focal findings or movement disorder noted  PELVIC EXAM EXTERNAL GENITALIA: normal appearing vulva with no masses, tenderness or lesions VAGINA:  no abnormal discharge or lesions CERVIX: no lesions or cervical motion tenderness and Pap collected  ASSESSMENT: Normal pregnancy [redacted]w[redacted]d Estimated Date of Delivery: 04/16/24  Elevated BP today  PLAN: Routine prenatal care. We discussed an overview of prenatal care and when to call. Reviewed diet, exercise, and weight gain recommendations in pregnancy. Discussed benefits of breastfeeding and lactation resources at [facility].  NOB labs and genetic screening done today. Baseline pre-eclampsia labs ordered. BP check in one week. ROB in 4  weeks.  See orders  Guadlupe Spanish, CNM

## 2023-10-09 LAB — MICROSCOPIC EXAMINATION
Casts: NONE SEEN /[LPF]
RBC, Urine: NONE SEEN /[HPF] (ref 0–2)
WBC, UA: NONE SEEN /[HPF] (ref 0–5)

## 2023-10-09 LAB — PROTEIN / CREATININE RATIO, URINE
Creatinine, Urine: 74.3 mg/dL
Protein, Ur: 11.2 mg/dL
Protein/Creat Ratio: 151 mg/g{creat} (ref 0–200)

## 2023-10-09 LAB — URINALYSIS, ROUTINE W REFLEX MICROSCOPIC
Bilirubin, UA: NEGATIVE
Glucose, UA: NEGATIVE
Ketones, UA: NEGATIVE
Nitrite, UA: NEGATIVE
Protein,UA: NEGATIVE
RBC, UA: NEGATIVE
Specific Gravity, UA: 1.013 (ref 1.005–1.030)
Urobilinogen, Ur: 0.2 mg/dL (ref 0.2–1.0)
pH, UA: 7 (ref 5.0–7.5)

## 2023-10-10 LAB — CULTURE, OB URINE

## 2023-10-10 LAB — URINE CULTURE, OB REFLEX

## 2023-10-11 LAB — MONITOR DRUG PROFILE 14(MW)
Amphetamine Scrn, Ur: NEGATIVE ng/mL
BARBITURATE SCREEN URINE: NEGATIVE ng/mL
BENZODIAZEPINE SCREEN, URINE: NEGATIVE ng/mL
Buprenorphine, Urine: NEGATIVE ng/mL
CANNABINOIDS UR QL SCN: NEGATIVE ng/mL
Cocaine (Metab) Scrn, Ur: NEGATIVE ng/mL
Creatinine(Crt), U: 74.2 mg/dL (ref 20.0–300.0)
Fentanyl, Urine: NEGATIVE pg/mL
Meperidine Screen, Urine: NEGATIVE ng/mL
Methadone Screen, Urine: NEGATIVE ng/mL
OXYCODONE+OXYMORPHONE UR QL SCN: NEGATIVE ng/mL
Opiate Scrn, Ur: NEGATIVE ng/mL
Ph of Urine: 6.7 (ref 4.5–8.9)
Phencyclidine Qn, Ur: NEGATIVE ng/mL
Propoxyphene Scrn, Ur: NEGATIVE ng/mL
SPECIFIC GRAVITY: 1.014
Tramadol Screen, Urine: NEGATIVE ng/mL

## 2023-10-11 LAB — NICOTINE SCREEN, URINE: Cotinine Ql Scrn, Ur: NEGATIVE ng/mL

## 2023-10-12 ENCOUNTER — Encounter: Payer: Self-pay | Admitting: Obstetrics

## 2023-10-12 LAB — CYTOLOGY - PAP
Chlamydia: NEGATIVE
Comment: NEGATIVE
Comment: NEGATIVE
Comment: NORMAL
Diagnosis: NEGATIVE
Neisseria Gonorrhea: NEGATIVE
Trichomonas: NEGATIVE

## 2023-10-13 LAB — CBC/D/PLT+RPR+RH+ABO+RUBIGG...
Antibody Screen: NEGATIVE
Basophils Absolute: 0 10*3/uL (ref 0.0–0.2)
Basos: 0 %
EOS (ABSOLUTE): 0 10*3/uL (ref 0.0–0.4)
Eos: 0 %
HCV Ab: NONREACTIVE
HIV Screen 4th Generation wRfx: NONREACTIVE
Hematocrit: 43.1 % (ref 34.0–46.6)
Hemoglobin: 14.1 g/dL (ref 11.1–15.9)
Hepatitis B Surface Ag: NEGATIVE
Immature Grans (Abs): 0 10*3/uL (ref 0.0–0.1)
Immature Granulocytes: 0 %
Lymphocytes Absolute: 1.8 10*3/uL (ref 0.7–3.1)
Lymphs: 16 %
MCH: 28.3 pg (ref 26.6–33.0)
MCHC: 32.7 g/dL (ref 31.5–35.7)
MCV: 87 fL (ref 79–97)
Monocytes Absolute: 0.6 10*3/uL (ref 0.1–0.9)
Monocytes: 6 %
Neutrophils Absolute: 8.4 10*3/uL — ABNORMAL HIGH (ref 1.4–7.0)
Neutrophils: 78 %
Platelets: 248 10*3/uL (ref 150–450)
RBC: 4.98 x10E6/uL (ref 3.77–5.28)
RDW: 12.8 % (ref 11.7–15.4)
RPR Ser Ql: NONREACTIVE
Rh Factor: POSITIVE
Rubella Antibodies, IGG: 1.14 {index} (ref >0.99)
Varicella zoster IgG: NONREACTIVE
WBC: 10.8 10*3/uL (ref 3.4–10.8)

## 2023-10-13 LAB — COMPREHENSIVE METABOLIC PANEL
ALT: 44 [IU]/L — ABNORMAL HIGH (ref 0–32)
AST: 29 [IU]/L (ref 0–40)
Albumin: 4.5 g/dL (ref 4.0–5.0)
Alkaline Phosphatase: 103 [IU]/L (ref 44–121)
BUN/Creatinine Ratio: 13 (ref 9–23)
BUN: 7 mg/dL (ref 6–20)
Bilirubin Total: 0.3 mg/dL (ref 0.0–1.2)
CO2: 19 mmol/L — ABNORMAL LOW (ref 20–29)
Calcium: 9.9 mg/dL (ref 8.7–10.2)
Chloride: 104 mmol/L (ref 96–106)
Creatinine, Ser: 0.56 mg/dL — ABNORMAL LOW (ref 0.57–1.00)
Globulin, Total: 2.4 g/dL (ref 1.5–4.5)
Glucose: 76 mg/dL (ref 70–99)
Potassium: 4.1 mmol/L (ref 3.5–5.2)
Sodium: 140 mmol/L (ref 134–144)
Total Protein: 6.9 g/dL (ref 6.0–8.5)
eGFR: 133 mL/min/{1.73_m2} (ref >59)

## 2023-10-13 LAB — HGB FRACTIONATION CASCADE
Hgb A2: 2.7 % (ref 1.8–3.2)
Hgb A: 97.3 % (ref 96.4–98.8)
Hgb F: 0 % (ref 0.0–2.0)
Hgb S: 0 %

## 2023-10-13 LAB — HCV INTERPRETATION

## 2023-10-14 LAB — MATERNIT 21 PLUS CORE, BLOOD
Fetal Fraction: 14
Result (T21): NEGATIVE
Trisomy 13 (Patau syndrome): NEGATIVE
Trisomy 18 (Edwards syndrome): NEGATIVE
Trisomy 21 (Down syndrome): NEGATIVE

## 2023-10-15 ENCOUNTER — Ambulatory Visit (INDEPENDENT_AMBULATORY_CARE_PROVIDER_SITE_OTHER): Payer: Medicaid Other

## 2023-10-15 VITALS — BP 139/77 | Ht 65.0 in | Wt 229.0 lb

## 2023-10-15 DIAGNOSIS — Z0130 Encounter for examination of blood pressure without abnormal findings: Secondary | ICD-10-CM

## 2023-10-15 NOTE — Patient Instructions (Signed)
Hypertension During Pregnancy Hypertension is also called high blood pressure. High blood pressure means that the force of the blood moving in your body is high enough to cause problems for you and your baby. Different types of high blood pressure can happen during pregnancy. The types are: High blood pressure before you got pregnant. This is called chronic hypertension.  This can continue during your pregnancy. Your doctor will want to keep checking your blood pressure. You may need medicine to control your blood pressure while you are pregnant. You will need follow-up visits after you have your baby. High blood pressure that goes up during pregnancy when it was normal before. This is called gestational hypertension. It will often get better after you have your baby, but your doctor will need to watch your blood pressure to make sure that it is getting better. You may develop high blood pressure after giving birth. This is called postpartum hypertension. This often occurs within 48 hours after childbirth but may occur up to 6 weeks after giving birth. Very high blood pressure during pregnancy is an emergency that needs treatment right away. How does this affect me? If you have high blood pressure during pregnancy, you have a higher chance of developing high blood pressure: As you get older. If you get pregnant again. In some cases, high blood pressure during pregnancy can cause: Stroke. Heart attack. Damage to the kidneys, lungs, or liver. Preeclampsia. HELLP syndrome. Seizures. Problems with the placenta. How does this affect my baby? Your baby may: Be born early. Not weigh as much as he or she should. Not handle labor well, leading to a C-section. This condition may also result in a baby's death before birth (stillbirth). What are the risks? Having high blood pressure during a past pregnancy. Being overweight. Being age 73 or older. Being pregnant for the first time. Being pregnant  with more than one baby. Becoming pregnant using fertility methods, such as IVF. Having other problems, such as diabetes or kidney disease. What can I do to lower my risk?  Keep a healthy weight. Eat a healthy diet. Follow what your doctor tells you about treating any medical problems that you had before you got pregnant. It is very important to go to all of your doctor visits. Your doctor will check your blood pressure and make sure that your pregnancy is progressing as it should. Treatment should start early if a problem is found. How is this treated? Treatment for high blood pressure during pregnancy can vary. It depends on the type of high blood pressure you have and how serious it is. If you were taking medicine for your blood pressure before you got pregnant, talk with your doctor. You may need to change the medicine during pregnancy if it is not safe for your baby. If your blood pressure goes up during pregnancy, your doctor may order medicine to treat this. If you are at risk for preeclampsia, your doctor may tell you to take a low-dose aspirin while you are pregnant. If you have very high blood pressure, you may need to stay in the hospital so you and your baby can be watched closely. You may also need to take medicine to lower your blood pressure. In some cases, if your condition gets worse, you may need to have your baby early. Follow these instructions at home: Eating and drinking  Drink enough fluid to keep your pee (urine) pale yellow. Avoid caffeine. Lifestyle Do not smoke or use any products that contain  nicotine or tobacco. If you need help quitting, ask your doctor. Do not use alcohol or drugs. Avoid stress. Rest and get plenty of sleep. Regular exercise can help. Ask your doctor what kinds of exercise are best for you. General instructions Take over-the-counter and prescription medicines only as told by your doctor. Keep all prenatal and follow-up visits. Contact a  doctor if: You have symptoms that your doctor told you to watch for, such as: Headaches. A feeling like you may vomit (nausea). Vomiting. Belly (abdominal) pain. Feeling dizzy or light-headed. Get help right away if: You have symptoms of serious problems, such as: Very bad belly pain that does not get better with treatment. A very bad headache that does not get better. Blurry vision. Double vision. Vomiting that does not get better. Sudden, fast weight gain. Sudden swelling in your hands, ankles, or face. Bleeding from your vagina. Blood in your pee. Shortness of breath. Chest pain. Weakness on one side of your body. Trouble talking. Your baby is not moving as much as usual. These symptoms may be an emergency. Get help right away. Call your local emergency services (911 in the [place] Do not wait to see if the symptoms will go away. Do not drive yourself to the hospital. Summary High blood pressure is also called hypertension. High blood pressure means that the force of the blood moving in your body is high enough to cause problems for you and your baby. Get help right away if you have symptoms of serious problems due to high blood pressure. Keep all prenatal and follow-up visits. This information is not intended to replace advice given to you by your health care provider. Make sure you discuss any questions you have with your health care provider. Document Revised: 08/01/2020 Document Reviewed: 08/01/2020 Elsevier Patient Education  2024 [employer].

## 2023-10-15 NOTE — Progress Notes (Signed)
    NURSE VISIT NOTE  Subjective:    Patient ID: Kathleen Wong, female    DOB: 2001-01-17, 22 y.o.   MRN: [id number]  HPI  Patient is a 22 y.o. G23P0000 female who presents for BP check per order from Guadlupe Spanish, CNM.   Patient reports no headaches or blurred vision, she is not on BP medication at this time. She was only told to take aspirin for now. She also states she checked her BP at [facility] and it was normal. Pt aware to let us know if any Headaches or blurred vision.   BP Readings from Last 3 Encounters:  10/15/23 139/77  10/08/23 (!) 143/88  08/09/23 134/74   Pulse Readings from Last 3 Encounters:  10/08/23 88  06/01/23 78  02/13/21 78    Objective:    BP 139/77   Ht 5\' 5"  (1.651 m)   Wt 229 lb (103.9 kg)   LMP 06/24/2023 (Within Days)   BMI 38.11 kg/m   Assessment:   No diagnosis found.   Plan:   Per Dr. Guadlupe Spanish, CNM:  Continue to monitor blood pressure at home. Return to clinic as scheduled.  Patient verbalized understanding of instructions.   Cornelius Moras, CMA

## 2023-11-05 ENCOUNTER — Encounter: Payer: Self-pay | Admitting: Licensed Practical Nurse

## 2023-11-05 ENCOUNTER — Ambulatory Visit (INDEPENDENT_AMBULATORY_CARE_PROVIDER_SITE_OTHER): Payer: Medicaid Other | Admitting: Licensed Practical Nurse

## 2023-11-05 VITALS — BP 142/94 | HR 121 | Wt 230.4 lb

## 2023-11-05 DIAGNOSIS — O10012 Pre-existing essential hypertension complicating pregnancy, second trimester: Secondary | ICD-10-CM

## 2023-11-05 DIAGNOSIS — O10919 Unspecified pre-existing hypertension complicating pregnancy, unspecified trimester: Secondary | ICD-10-CM | POA: Insufficient documentation

## 2023-11-05 DIAGNOSIS — Z3402 Encounter for supervision of normal first pregnancy, second trimester: Secondary | ICD-10-CM

## 2023-11-05 DIAGNOSIS — Z3A16 16 weeks gestation of pregnancy: Secondary | ICD-10-CM

## 2023-11-05 DIAGNOSIS — I10 Essential (primary) hypertension: Secondary | ICD-10-CM | POA: Insufficient documentation

## 2023-11-05 MED ORDER — LABETALOL HCL 100 MG PO TABS: 100.0000 mg | ORAL_TABLET | Freq: Two times a day (BID) | ORAL | 6 refills | Status: DC

## 2023-11-05 NOTE — Progress Notes (Signed)
Routine Prenatal Care Visit  Subjective  Kathleen Wong is a 22 y.o. G1P0000 at [redacted]w[redacted]d being seen today for ongoing prenatal care.  She is currently monitored for the following issues for this high-risk pregnancy and has Supervision of other normal pregnancy, antepartum and Elevated blood pressure reading on their problem list.  ----------------------------------------------------------------------------------- Patient reports no complaints.   Here with partner and his daughter and mother -BP elevated today, Reviewed with Dr Logan Bores, pt has CHTN, will start Labetalol.  -encouraged CBE and BF classes  Contractions: Not present. Vag. Bleeding: None.  Movement: Absent. Leaking Fluid denies.  ----------------------------------------------------------------------------------- The following portions of the patient's history were reviewed and updated as appropriate: allergies, current medications, past family history, past medical history, past social history, past surgical history and problem list. Problem list updated.  Objective  Blood pressure (!) 142/94, pulse (!) 121, weight 230 lb 6.4 oz (104.5 kg), last menstrual period 06/24/2023. Pregravid weight 225 lb (102.1 kg) Total Weight Gain 5 lb 6.4 oz (2.449 kg) Urinalysis: Urine Protein    Urine Glucose    Fetal Status: Fetal Heart Rate (bpm): 145   Movement: Absent     General:  Alert, oriented and cooperative. Patient is in no acute distress.  Skin: Skin is warm and dry. No rash noted.   Cardiovascular: Normal heart rate noted  Respiratory: Normal respiratory effort, no problems with respiration noted  Abdomen: Soft, gravid, appropriate for gestational age. Pain/Pressure: Absent     Pelvic:  Cervical exam deferred        Extremities: Normal range of motion.  Edema: None  Mental Status: Normal mood and affect. Normal behavior. Normal judgment and thought content.   Assessment   22 y.o. G1P0000 at [redacted]w[redacted]d by  04/16/2024, by Ultrasound  presenting for routine prenatal visit  Plan   first Problems (from 08/17/23 to present)     Problem Noted Diagnosed Resolved   Supervision of other normal pregnancy, antepartum 08/17/2023 by Loran Senters, CMA  No   Overview Addendum 08/17/2023  9:46 AM by Loran Senters, CMA   Clinical Staff Provider  Office Location  [facility] Dating  Not found.  Language  English Anatomy US    Flu Vaccine  offer Genetic Screen  NIPS:   TDaP vaccine   offer Hgb A1C or  GTT Early : Third trimester :   Covid declined   LAB RESULTS   Rhogam     Blood Type     RSV  Antibody    Feeding Plan breast Rubella    Contraception undecided RPR     Circumcision yes HBsAg     Pediatrician  [facility]    Support Person Tora Perches,  David's mom Varicella    Prenatal Classes yes GBS  (For PCN allergy, check sensitivities)     Hep C     BTL Consent  Pap No results found for: "DIAGPAP"  VBAC Consent  Hgb Electro      CF      SMA                    Preterm labor symptoms and general obstetric precautions including but not limited to vaginal bleeding, contractions, leaking of fluid and fetal movement were reviewed in detail with the patient. Please refer to After Visit Summary for other counseling recommendations.   Return in about 1 week (around 11/12/2023) for BP check .  Anatomy US ordered  Carie Caddy, CNM   [facility]  11/05/23  10:56 AM

## 2023-11-12 ENCOUNTER — Ambulatory Visit (INDEPENDENT_AMBULATORY_CARE_PROVIDER_SITE_OTHER): Payer: Medicaid Other

## 2023-11-12 VITALS — BP 127/67 | HR 96 | Wt 231.4 lb

## 2023-11-12 DIAGNOSIS — O10919 Unspecified pre-existing hypertension complicating pregnancy, unspecified trimester: Secondary | ICD-10-CM

## 2023-11-12 DIAGNOSIS — Z0130 Encounter for examination of blood pressure without abnormal findings: Secondary | ICD-10-CM

## 2023-11-12 NOTE — Progress Notes (Signed)
    NURSE VISIT NOTE  Subjective:    Patient ID: HERO PREM, female    DOB: Nov 14, 2001, 22 y.o.   MRN: [id number]  HPI  Patient is a 22 y.o. G109P0000 female who presents for BP check per order from Carie Caddy, [place].   Patient reports compliance with prescribed BP medications: yes Labetalol 100 mgtwice daily Last dose of BP medication:  11/11/23 at 6:30PM  BP Readings from Last 3 Encounters:  11/12/23 127/67  11/05/23 (!) 142/94  10/15/23 139/77   Pulse Readings from Last 3 Encounters:  11/12/23 96  11/05/23 (!) 121  10/08/23 88    Objective:    BP 127/67   Pulse 96   Wt 231 lb 6.4 oz (105 kg)   LMP 06/24/2023 (Within Days)   BMI 38.51 kg/m   Assessment:   1. Chronic hypertension affecting pregnancy      Plan:   Per Dr. Guadlupe Spanish, CNM:  Continue current treatment regimen. Continue to monitor blood pressure at home. Report any reading >140/90 or with any associated symptoms. Return to clinic as scheduled.  Patient verbalized understanding of instructions.   Fonda Kinder, CMA

## 2023-11-12 NOTE — Patient Instructions (Signed)
Hypertension During Pregnancy Hypertension is also called high blood pressure. High blood pressure means that the force of the blood moving in your body is high enough to cause problems for you and your baby. Different types of high blood pressure can happen during pregnancy. The types are: High blood pressure before you got pregnant. This is called chronic hypertension.  This can continue during your pregnancy. Your doctor will want to keep checking your blood pressure. You may need medicine to control your blood pressure while you are pregnant. You will need follow-up visits after you have your baby. High blood pressure that goes up during pregnancy when it was normal before. This is called gestational hypertension. It will often get better after you have your baby, but your doctor will need to watch your blood pressure to make sure that it is getting better. You may develop high blood pressure after giving birth. This is called postpartum hypertension. This often occurs within 48 hours after childbirth but may occur up to 6 weeks after giving birth. Very high blood pressure during pregnancy is an emergency that needs treatment right away. How does this affect me? If you have high blood pressure during pregnancy, you have a higher chance of developing high blood pressure: As you get older. If you get pregnant again. In some cases, high blood pressure during pregnancy can cause: Stroke. Heart attack. Damage to the kidneys, lungs, or liver. Preeclampsia. HELLP syndrome. Seizures. Problems with the placenta. How does this affect my baby? Your baby may: Be born early. Not weigh as much as he or she should. Not handle labor well, leading to a C-section. This condition may also result in a baby's death before birth (stillbirth). What are the risks? Having high blood pressure during a past pregnancy. Being overweight. Being age 35 or older. Being pregnant for the first time. Being pregnant  with more than one baby. Becoming pregnant using fertility methods, such as IVF. Having other problems, such as diabetes or kidney disease. What can I do to lower my risk?  Keep a healthy weight. Eat a healthy diet. Follow what your doctor tells you about treating any medical problems that you had before you got pregnant. It is very important to go to all of your doctor visits. Your doctor will check your blood pressure and make sure that your pregnancy is progressing as it should. Treatment should start early if a problem is found. How is this treated? Treatment for high blood pressure during pregnancy can vary. It depends on the type of high blood pressure you have and how serious it is. If you were taking medicine for your blood pressure before you got pregnant, talk with your doctor. You may need to change the medicine during pregnancy if it is not safe for your baby. If your blood pressure goes up during pregnancy, your doctor may order medicine to treat this. If you are at risk for preeclampsia, your doctor may tell you to take a low-dose aspirin while you are pregnant. If you have very high blood pressure, you may need to stay in the hospital so you and your baby can be watched closely. You may also need to take medicine to lower your blood pressure. In some cases, if your condition gets worse, you may need to have your baby early. Follow these instructions at home: Eating and drinking  Drink enough fluid to keep your pee (urine) pale yellow. Avoid caffeine. Lifestyle Do not smoke or use any products that contain   nicotine or tobacco. If you need help quitting, ask your doctor. Do not use alcohol or drugs. Avoid stress. Rest and get plenty of sleep. Regular exercise can help. Ask your doctor what kinds of exercise are best for you. General instructions Take over-the-counter and prescription medicines only as told by your doctor. Keep all prenatal and follow-up visits. Contact a  doctor if: You have symptoms that your doctor told you to watch for, such as: Headaches. A feeling like you may vomit (nausea). Vomiting. Belly (abdominal) pain. Feeling dizzy or light-headed. Get help right away if: You have symptoms of serious problems, such as: Very bad belly pain that does not get better with treatment. A very bad headache that does not get better. Blurry vision. Double vision. Vomiting that does not get better. Sudden, fast weight gain. Sudden swelling in your hands, ankles, or face. Bleeding from your vagina. Blood in your pee. Shortness of breath. Chest pain. Weakness on one side of your body. Trouble talking. Your baby is not moving as much as usual. These symptoms may be an emergency. Get help right away. Call your local emergency services (911 in the [place] Do not wait to see if the symptoms will go away. Do not drive yourself to the hospital. Summary High blood pressure is also called hypertension. High blood pressure means that the force of the blood moving in your body is high enough to cause problems for you and your baby. Get help right away if you have symptoms of serious problems due to high blood pressure. Keep all prenatal and follow-up visits. This information is not intended to replace advice given to you by your health care provider. Make sure you discuss any questions you have with your health care provider. Document Revised: 08/01/2020 Document Reviewed: 08/01/2020 Elsevier Patient Education  2024 [employer].

## 2023-11-20 ENCOUNTER — Other Ambulatory Visit: Payer: Self-pay

## 2023-11-20 DIAGNOSIS — O26892 Other specified pregnancy related conditions, second trimester: Secondary | ICD-10-CM | POA: Insufficient documentation

## 2023-11-20 DIAGNOSIS — W19XXXA Unspecified fall, initial encounter: Secondary | ICD-10-CM | POA: Insufficient documentation

## 2023-11-20 DIAGNOSIS — R109 Unspecified abdominal pain: Secondary | ICD-10-CM | POA: Diagnosis not present

## 2023-11-20 DIAGNOSIS — Z3A19 19 weeks gestation of pregnancy: Secondary | ICD-10-CM | POA: Diagnosis not present

## 2023-11-20 NOTE — ED Triage Notes (Addendum)
Pt reports falling tonight and injuring her right groin, pt also states she is [redacted] week pregnant and kneed herself in the stomach. Pt ambulatory denies vaginal bleeding

## 2023-11-20 NOTE — ED Notes (Signed)
2 attempts made at assessing FHT in triage. RN unable to obtain with doppler.

## 2023-11-21 ENCOUNTER — Emergency Department
Admission: EM | Admit: 2023-11-21 | Discharge: 2023-11-21 | Disposition: A | Discharge status: Home / Self Care | Payer: Medicaid Other | Attending: Emergency Medicine | Admitting: Emergency Medicine

## 2023-11-21 DIAGNOSIS — W19XXXA Unspecified fall, initial encounter: Secondary | ICD-10-CM

## 2023-11-21 NOTE — Discharge Instructions (Signed)
Your workup has shown a normal fetal ultrasound with a good heart rate and good movement.  Please follow-up with your OB as scheduled.  Return to the emergency department for any abdominal pain any vaginal bleeding or fluid leakage.

## 2023-11-21 NOTE — ED Provider Notes (Signed)
° °  [facility] Provider Note    Event Date/Time   First MD Initiated Contact with Patient 11/21/23 0127     (approximate)  History   Chief Complaint: Fall  HPI  Kathleen Wong is a 22 y.o. female approximately [redacted] weeks pregnant who presents to the emergency department after a fall.  According to the patient she slipped and and trying to catch herself accidentally kneed herself in the abdomen.  Patient is [redacted] weeks pregnant, has not been able to feel the baby move during this pregnancy.  Denies any vaginal bleeding or fluid leakage.  Patient was concerned so she came to the emergency department for evaluation.  Physical Exam   Triage Vital Signs: ED Triage Vitals  Encounter Vitals Group     BP 11/20/23 2249 (!) 125/98     Systolic BP Percentile --      Diastolic BP Percentile --      Pulse Rate 11/20/23 2249 89     Resp 11/20/23 2249 18     Temp 11/20/23 2249 98.2 F (36.8 C)     Temp Source 11/20/23 2249 Oral     SpO2 11/20/23 2249 98 %     Weight 11/20/23 2248 231 lb (104.8 kg)     Height 11/20/23 2248 5\' 5"  (1.651 m)     Head Circumference --      Peak Flow --      Pain Score 11/20/23 2248 2     Pain Loc --      Pain Education --      Exclude from Growth Chart --     Most recent vital signs: Vitals:   11/20/23 2249  BP: (!) 125/98  Pulse: 89  Resp: 18  Temp: 98.2 F (36.8 C)  SpO2: 98%    General: Awake, no distress.  CV:  Good peripheral perfusion.  Regular rate and rhythm  Resp:  Normal effort.  Equal breath sounds bilaterally.  Abd:  No distention.  Soft, nontender.  No rebound or guarding.   ED Results / Procedures / Treatments   MEDICATIONS ORDERED IN ED: Medications - No data to display   IMPRESSION / MDM / ASSESSMENT AND PLAN / ED COURSE  I reviewed the triage vital signs and the nursing notes.  Patient's presentation is most consistent with acute illness / injury with system symptoms.  Patient presents to the  emergency department for abdominal discomfort after falling.  Patient states she accidentally kneed herself in the abdomen and was concerned about her pregnancy.  No vaginal bleeding or fluid leakage.  Patient has a benign abdomen on my exam.  Bedside ultrasound shows a fetal heart rate of 148 bpm with great fetal movement normal-appearing amount of amniotic fluid and a anterior placenta.  Given the patient's reassuring workup I believe the patient is safe for discharge home.  Patient is very reassured after seeing the ultrasound.  FINAL CLINICAL IMPRESSION(S) / ED DIAGNOSES   Abdominal pain during pregnancy   Note:  This document was prepared using Dragon voice recognition software and may include unintentional dictation errors.   Minna Antis, MD 11/21/23 [phone]

## 2023-11-23 ENCOUNTER — Telehealth: Payer: Self-pay

## 2023-11-23 ENCOUNTER — Ambulatory Visit
Admission: EM | Admit: 2023-11-23 | Discharge: 2023-11-23 | Disposition: A | Discharge status: Home / Self Care | Payer: Medicaid Other

## 2023-11-23 DIAGNOSIS — U071 COVID-19: Secondary | ICD-10-CM | POA: Diagnosis not present

## 2023-11-23 LAB — POC COVID19/FLU A&B COMBO
Covid Antigen, POC: POSITIVE — AB
Influenza A Antigen, POC: NEGATIVE
Influenza B Antigen, POC: NEGATIVE

## 2023-11-23 MED ORDER — PAXLOVID (300/100) 20 X 150 MG & 10 X 100MG PO TBPK: 3.0000 | ORAL_TABLET | Freq: Two times a day (BID) | ORAL | 0 refills | Status: AC

## 2023-11-23 MED ORDER — ALBUTEROL SULFATE HFA 108 (90 BASE) MCG/ACT IN AERS
2.0000 | INHALATION_SPRAY | RESPIRATORY_TRACT | 0 refills | Status: DC | PRN
Start: 2023-11-23 — End: 2024-01-27

## 2023-11-23 NOTE — Telephone Encounter (Signed)
 Patient called with concerns of having chest pain, cough, congestion and chills. She is [redacted]w[redacted]d with CHTN, she is taking Labetalol . I advised patient to go to ED to be evaluated. Talked with Dr.Roby about her and she suggested the same. Patient verbalized understanding.

## 2023-11-23 NOTE — ED Provider Notes (Signed)
 CAY RALPH PELT    CSN: [id number] Arrival date & time: 11/23/23  1529      History   Chief Complaint Chief Complaint  Patient presents with   Nasal Congestion   Cough    HPI SIAN ROCKERS is a 22 y.o. female.   Patient presents for evaluation of nasal congestion, rhinorrhea, facial pressure, nonproductive cough, throat irritation and chest congestion present for 1 day.  Experiencing shortness of breath with exertion.  Tolerating food and liquids.  No known sick contacts prior but did recently visit someone at the hospital.  Has not attempted treatment.  Currently [redacted] weeks pregnant  Past Medical History:  Diagnosis Date   Asthma    as a child    Patient Active Problem List   Diagnosis Date Noted   Chronic hypertension 11/05/2023   Elevated blood pressure reading 10/08/2023   Supervision of other normal pregnancy, antepartum 08/17/2023    Past Surgical History:  Procedure Laterality Date   NO PAST SURGERIES      OB History     Gravida  1   Para  0   Term  0   Preterm  0   AB  0   Living  0      SAB  0   IAB  0   Ectopic  0   Multiple  0   Live Births  0            Home Medications    Prior to Admission medications   Medication Sig Start Date End Date Taking? Authorizing Provider  albuterol  (VENTOLIN  HFA) 108 (90 Base) MCG/ACT inhaler Inhale 2 puffs into the lungs every 4 (four) hours as needed for wheezing or shortness of breath. 11/23/23  Yes Meribeth Vitug R, NP  nirmatrelvir/ritonavir (PAXLOVID , 300/100,) 20 x 150 MG & 10 x 100MG  TBPK Take 3 tablets by mouth 2 (two) times daily for 5 days. Patient GFR is 133. Take nirmatrelvir (150 mg) two tablets twice daily for 5 days and ritonavir (100 mg) one tablet twice daily for 5 days. 11/23/23 11/28/23 Yes Powell Halbert, Shelba SAUNDERS, NP  Prenatal Vit-Fe Fumarate-FA (MULTIVITAMIN-PRENATAL) 27-0.8 MG TABS tablet Take 1 tablet by mouth daily at 12 noon.   Yes [provider]  aspirin  EC  81 MG tablet Take 2 tablets (162 mg total) by mouth daily. Swallow whole. 10/08/23   Justino Eleanor HERO, CNM  labetalol  (NORMODYNE ) 100 MG tablet Take 1 tablet (100 mg total) by mouth 2 (two) times daily. 11/05/23   Dominic, Jinnie Jansky, CNM    Family History Family History  Problem Relation Age of Onset   Healthy Mother    Healthy Father    Healthy Sister    Healthy Sister    Healthy Brother    Healthy Maternal Grandmother    Healthy Maternal Grandfather    Heart attack Paternal Grandmother    Healthy Paternal Grandfather    Autism Maternal Uncle     Social History Social History   Tobacco Use   Smoking status: Never   Smokeless tobacco: Never  Vaping Use   Vaping status: Former   Quit date: 07/19/2023  Substance Use Topics   Alcohol use: Not Currently   Drug use: Never     Allergies   Patient has no known allergies.   Review of Systems Review of Systems  Respiratory:  Positive for cough.      Physical Exam Triage Vital Signs ED Triage Vitals  Encounter Vitals Group  BP 11/23/23 1635 98/63     Systolic BP Percentile --      Diastolic BP Percentile --      Pulse Rate 11/23/23 1635 99     Resp 11/23/23 1635 20     Temp 11/23/23 1635 99.2 F (37.3 C)     Temp src --      SpO2 11/23/23 1635 98 %     Weight --      Height --      Head Circumference --      Peak Flow --      Pain Score 11/23/23 1631 1     Pain Loc --      Pain Education --      Exclude from Growth Chart --    No data found.  Updated Vital Signs BP 98/63   Pulse 99   Temp 99.2 F (37.3 C)   Resp 20   LMP 06/24/2023 (Within Days)   SpO2 98%   Visual Acuity Right Eye Distance:   Left Eye Distance:   Bilateral Distance:    Right Eye Near:   Left Eye Near:    Bilateral Near:     Physical Exam Constitutional:      Appearance: Normal appearance.  HENT:     Head: Normocephalic.     Right Ear: Tympanic membrane, ear canal and external ear normal.     Left Ear: Tympanic  membrane, ear canal and external ear normal.     Nose: Congestion present. No rhinorrhea.     Mouth/Throat:     Mouth: Mucous membranes are moist.     Pharynx: Oropharynx is clear. No oropharyngeal exudate or posterior oropharyngeal erythema.  Eyes:     Extraocular Movements: Extraocular movements intact.  Cardiovascular:     Rate and Rhythm: Normal rate and regular rhythm.     Pulses: Normal pulses.     Heart sounds: Normal heart sounds.  Pulmonary:     Effort: Pulmonary effort is normal.     Breath sounds: Normal breath sounds.  Musculoskeletal:     Cervical back: Normal range of motion and neck supple.  Neurological:     Mental Status: She is alert and oriented to person, place, and time. Mental status is at baseline.      UC Treatments / Results  Labs (all labs ordered are listed, but only abnormal results are displayed) Labs Reviewed  POC COVID19/FLU A&B COMBO - Abnormal; Notable for the following components:      Result Value   Covid Antigen, POC Positive (*)    All other components within normal limits    EKG   Radiology No results found.  Procedures Procedures (including critical care time)  Medications Ordered in UC Medications - No data to display  Initial Impression / Assessment and Plan / UC Course  I have reviewed the triage vital signs and the nursing notes.  Pertinent labs & imaging results that were available during my care of the patient were reviewed by me and considered in my medical decision making (see chart for details).   Covid 19  Patient is in no signs of distress nor toxic appearing.  Vital signs are stable.  Low suspicion for pneumonia, pneumothorax or bronchitis and therefore will defer imaging.   Prescribed Paxlovid  and Adderall inhaler.  Viral illness most likely flaring asthma, discussed this with patient and parent.  Prescribed Paxlovid  and albuterol  inhaler. May use additional over-the-counter medications as needed for supportive  care.  May  follow-up with urgent care as needed if symptoms persist or worsen.  Note given.    Final Clinical Impressions(s) / UC Diagnoses   Final diagnoses:  COVID-19     Discharge Instructions      COVID-19 is a virus and should steadily improve in time it can take up to 7 to 10 days before you truly start to see a turnaround however things will get better  Begin Paxlovid  every morning and every evening for 5 days, helps to minimize amount of virus in the body which helps to minimize symptoms, does not fully take away illness  Use albuterol  inhaler taking 2 puffs every 4-6 hours for chest discomfort    You can take Tylenol  and/or Ibuprofen  as needed for fever reduction and pain relief.   For cough: honey 1/2 to 1 teaspoon (you can dilute the honey in water or another fluid).  You can also use guaifenesin and dextromethorphan for cough. You can use a humidifier for chest congestion and cough.  If you don't have a humidifier, you can sit in the bathroom with the hot shower running.      For sore throat: try warm salt water gargles, cepacol lozenges, throat spray, warm tea or water with lemon/honey, popsicles or ice, or OTC cold relief medicine for throat discomfort.   For congestion: take a daily anti-histamine like Zyrtec, Claritin, and a oral decongestant, such as pseudoephedrine.  You can also use Flonase 1-2 sprays in each nostril daily.   It is important to stay hydrated: drink plenty of fluids (water, gatorade/powerade/pedialyte, juices, or teas) to keep your throat moisturized and help further relieve irritation/discomfort.    ED Prescriptions     Medication Sig Dispense Auth. Provider   albuterol  (VENTOLIN  HFA) 108 (90 Base) MCG/ACT inhaler Inhale 2 puffs into the lungs every 4 (four) hours as needed for wheezing or shortness of breath. 18 g Jazmine Heckman R, NP   nirmatrelvir/ritonavir (PAXLOVID , 300/100,) 20 x 150 MG & 10 x 100MG  TBPK Take 3 tablets by mouth 2 (two)  times daily for 5 days. Patient GFR is 133. Take nirmatrelvir (150 mg) two tablets twice daily for 5 days and ritonavir (100 mg) one tablet twice daily for 5 days. 30 tablet Shaina Gullatt R, NP      PDMP not reviewed this encounter.   Teresa Shelba SAUNDERS, NP 11/23/23 1929

## 2023-11-23 NOTE — Discharge Instructions (Signed)
 COVID-19 is a virus and should steadily improve in time it can take up to 7 to 10 days before you truly start to see a turnaround however things will get better  Begin Paxlovid  every morning and every evening for 5 days, helps to minimize amount of virus in the body which helps to minimize symptoms, does not fully take away illness  Use albuterol  inhaler taking 2 puffs every 4-6 hours for chest discomfort    You can take Tylenol  and/or Ibuprofen  as needed for fever reduction and pain relief.   For cough: honey 1/2 to 1 teaspoon (you can dilute the honey in water or another fluid).  You can also use guaifenesin and dextromethorphan for cough. You can use a humidifier for chest congestion and cough.  If you don't have a humidifier, you can sit in the bathroom with the hot shower running.      For sore throat: try warm salt water gargles, cepacol lozenges, throat spray, warm tea or water with lemon/honey, popsicles or ice, or OTC cold relief medicine for throat discomfort.   For congestion: take a daily anti-histamine like Zyrtec, Claritin, and a oral decongestant, such as pseudoephedrine.  You can also use Flonase 1-2 sprays in each nostril daily.   It is important to stay hydrated: drink plenty of fluids (water, gatorade/powerade/pedialyte, juices, or teas) to keep your throat moisturized and help further relieve irritation/discomfort.

## 2023-11-23 NOTE — ED Triage Notes (Addendum)
 Patient to Urgent Care with complaints of chest congestion/ sore throat/ cough/ cold chills/ headaches/ post-nasal drip. Some shortness of breath/ easily winded.   Symptoms started yesterday. States she started having some intermittent chest pains when coughing last night. Poor sleep.   Currently 19w 2d pregnant. Multiple sick contacts.

## 2023-11-24 NOTE — L&D Delivery Note (Signed)
 Date of delivery: 03/31/24 Estimated Date of Delivery: 04/16/24 EGA: [redacted]w[redacted]d  Hospital Course summary: GENAYA VENDETTI is a 23 y.o. G1P0000 @ [redacted]w[redacted]d who was admitted on 03/30/2024 for IOL for [facility] with superimposed preeclampsia.   Delivery Note At 11:58 AM a viable female was delivered via Vaginal, Spontaneous (Presentation: Left Occiput Anterior).  APGAR: 2, 4, 9 ; weight  6lb 15oz.   Placenta status: Spontaneous, Intact.  Cord: 3 vessels with the following complications: None.  Anesthesia: Epidural Episiotomy: None Lacerations:  intact Est. Blood Loss (mL):  300  Mom to postpartum.  Baby to Couplet care / Skin to Skin.  Lou Rounds 03/31/2024, 12:21 PM    Delivery Narrative  [name] was complete at 1137 and began pushing at 1044. She delivered a vigorous female infant named Myrtie Atkinson at [employer]. Apgars 2, 4, 9. The head followed by shoulders which were delivered without difficulty, and the rest of the body. Baby was immediately placed skin to skin on mother's chest. No Nuchal cord was noted. Baby had low tone and poor color, did not start crying with initial stimulation so cord was cut before one minute and baby taken to warmer for further evaluation. He required several minutes of PPV but was then able to be placed skin to skin with mother. Cord blood was not obtained. Placenta was delivered at 1204, primarily by maternal efforts and with some slight cord traction. Placenta was intact, Duncan mechanism, 3 VC. Perineum inspected and found to be intact. AMSTL with IV pitocin  per unit protocol. EBL 300. Mother and baby stable.

## 2023-11-25 ENCOUNTER — Other Ambulatory Visit: Payer: Self-pay | Admitting: Licensed Practical Nurse

## 2023-11-25 DIAGNOSIS — Z363 Encounter for antenatal screening for malformations: Secondary | ICD-10-CM

## 2023-11-25 DIAGNOSIS — Z3402 Encounter for supervision of normal first pregnancy, second trimester: Secondary | ICD-10-CM

## 2023-11-25 DIAGNOSIS — O10919 Unspecified pre-existing hypertension complicating pregnancy, unspecified trimester: Secondary | ICD-10-CM

## 2023-11-25 DIAGNOSIS — Z3A16 16 weeks gestation of pregnancy: Secondary | ICD-10-CM

## 2023-11-25 DIAGNOSIS — I10 Essential (primary) hypertension: Secondary | ICD-10-CM

## 2023-11-29 ENCOUNTER — Encounter: Payer: Self-pay | Admitting: Advanced Practice Midwife

## 2023-11-29 ENCOUNTER — Encounter: Payer: Medicaid Other | Admitting: Advanced Practice Midwife

## 2023-11-29 ENCOUNTER — Other Ambulatory Visit: Payer: Medicaid Other

## 2023-11-29 ENCOUNTER — Ambulatory Visit: Payer: Medicaid Other

## 2023-11-29 ENCOUNTER — Ambulatory Visit (INDEPENDENT_AMBULATORY_CARE_PROVIDER_SITE_OTHER): Payer: Medicaid Other | Admitting: Advanced Practice Midwife

## 2023-11-29 ENCOUNTER — Other Ambulatory Visit: Payer: Self-pay | Admitting: Licensed Practical Nurse

## 2023-11-29 VITALS — BP 136/89 | HR 93 | Wt 230.3 lb

## 2023-11-29 DIAGNOSIS — O10012 Pre-existing essential hypertension complicating pregnancy, second trimester: Secondary | ICD-10-CM

## 2023-11-29 DIAGNOSIS — Z3402 Encounter for supervision of normal first pregnancy, second trimester: Secondary | ICD-10-CM

## 2023-11-29 DIAGNOSIS — E669 Obesity, unspecified: Secondary | ICD-10-CM | POA: Diagnosis not present

## 2023-11-29 DIAGNOSIS — O10919 Unspecified pre-existing hypertension complicating pregnancy, unspecified trimester: Secondary | ICD-10-CM

## 2023-11-29 DIAGNOSIS — O99212 Obesity complicating pregnancy, second trimester: Secondary | ICD-10-CM | POA: Diagnosis not present

## 2023-11-29 DIAGNOSIS — Z3A20 20 weeks gestation of pregnancy: Secondary | ICD-10-CM

## 2023-11-29 DIAGNOSIS — Z363 Encounter for antenatal screening for malformations: Secondary | ICD-10-CM

## 2023-11-29 DIAGNOSIS — O0992 Supervision of high risk pregnancy, unspecified, second trimester: Secondary | ICD-10-CM

## 2023-11-29 DIAGNOSIS — I10 Essential (primary) hypertension: Secondary | ICD-10-CM

## 2023-11-29 DIAGNOSIS — Z3A16 16 weeks gestation of pregnancy: Secondary | ICD-10-CM

## 2023-11-29 DIAGNOSIS — Z369 Encounter for antenatal screening, unspecified: Secondary | ICD-10-CM

## 2023-11-29 MED ORDER — PRENATAL 27-0.8 MG PO TABS: 1.0000 | ORAL_TABLET | Freq: Every day | ORAL | 11 refills | Status: AC

## 2023-11-29 NOTE — Addendum Note (Signed)
 Addended by: Tresea Mall on: 11/29/2023 03:51 PM   Modules accepted: Orders

## 2023-11-29 NOTE — Patient Instructions (Signed)

## 2023-11-29 NOTE — Progress Notes (Addendum)
 Routine Prenatal Care Visit  Subjective  Kathleen Wong is a 23 y.o. G1P0000 at [redacted]w[redacted]d being seen today for ongoing prenatal care.  She is currently monitored for the following issues for this high-risk pregnancy and has Supervision of high-risk pregnancy; Elevated blood pressure reading; and Chronic hypertension on their problem list.  ----------------------------------------------------------------------------------- Patient reports occasional mild cramping. Her anatomy scan has been rescheduled to Wednesday due to the weather delay this morning. She did not take Labetalol  yet today. She is accompanied by her future mother in law.    Addendum: patient ended up having anatomy scan here today. Recommendation to have f/u with MFM due to [facility]. Contractions: Not present. Vag. Bleeding: None.  Movement: Absent. Leaking Fluid denies.  ----------------------------------------------------------------------------------- The following portions of the patient's history were reviewed and updated as appropriate: allergies, current medications, past family history, past medical history, past social history, past surgical history and problem list. Problem list updated.  Objective  Blood pressure (!) 136/95, pulse (!) 103, weight 230 lb 4.8 oz (104.5 kg), last menstrual period 06/24/2023. Repeat blood pressure 136/89 Pregravid weight 225 lb (102.1 kg) Total Weight Gain 5 lb 4.8 oz (2.404 kg) Urinalysis: Urine Protein    Urine Glucose    Fetal Status: Fetal Heart Rate (bpm): 146 Fundal Height: 21 cm Movement: Absent     General:  Alert, oriented and cooperative. Patient is in no acute distress.  Skin: Skin is warm and dry. No rash noted.   Cardiovascular: Normal heart rate noted  Respiratory: Normal respiratory effort, no problems with respiration noted  Abdomen: Soft, gravid, appropriate for gestational age. Pain/Pressure: Absent     Pelvic:  Cervical exam deferred        Extremities: Normal range of  motion.  Edema: None  Mental Status: Normal mood and affect. Normal behavior. Normal judgment and thought content.   Assessment   23 y.o. G1P0000 at [redacted]w[redacted]d by  04/16/2024, by Ultrasound presenting for routine prenatal visit  Plan   first Problems (from 08/17/23 to present)     Problem Noted Diagnosed Resolved   Chronic hypertension 11/05/2023 by Delinda Jinnie Jansky, CNM  No   Supervision of high-risk pregnancy 08/17/2023 by Vicci Levan, CMA  No   Overview Addendum 11/29/2023 10:25 AM by Donelda Burnard CROME, CMA   Clinical Staff Provider  Office Location  [facility] Dating  Not found.  Language  English Anatomy US     Flu Vaccine  Declined Genetic Screen  NIPS:   TDaP vaccine   offer Hgb A1C or  GTT Early : Third trimester :   Covid declined   LAB RESULTS   Rhogam     Blood Type     RSV  Antibody    Feeding Plan breast Rubella    Contraception undecided RPR     Circumcision yes HBsAg     Pediatrician  [facility]    Support Person Alm Plaster,  David's mom Varicella    Prenatal Classes yes GBS  (For PCN allergy, check sensitivities)     Hep C     BTL Consent  Pap No results found for: DIAGPAP  VBAC Consent  Hgb Electro      CF      SMA                    Preterm labor symptoms and general obstetric precautions including but not limited to vaginal bleeding, contractions, leaking of fluid and fetal movement were reviewed in detail with  the patient. Please refer to After Visit Summary for other counseling recommendations.   MFM follow up anatomy ultrasound in 4 weeks Return in about 4 weeks (around 12/27/2023) for rob.  Slater Rains, CNM 11/29/2023 10:40 AM

## 2023-12-01 ENCOUNTER — Ambulatory Visit: Payer: Medicaid Other

## 2023-12-08 ENCOUNTER — Telehealth: Payer: Self-pay

## 2023-12-08 NOTE — Telephone Encounter (Signed)
 No DPR on file. Left voicemail advising her to check her my chart and contact us  if she has any questions.

## 2023-12-27 ENCOUNTER — Encounter: Payer: Medicaid Other | Admitting: Advanced Practice Midwife

## 2023-12-28 ENCOUNTER — Ambulatory Visit: Payer: Medicaid Other | Admitting: Obstetrics

## 2023-12-28 VITALS — BP 122/66 | HR 98 | Wt 234.0 lb

## 2023-12-28 DIAGNOSIS — I10 Essential (primary) hypertension: Secondary | ICD-10-CM

## 2023-12-28 DIAGNOSIS — O10012 Pre-existing essential hypertension complicating pregnancy, second trimester: Secondary | ICD-10-CM | POA: Diagnosis not present

## 2023-12-28 DIAGNOSIS — R030 Elevated blood-pressure reading, without diagnosis of hypertension: Secondary | ICD-10-CM

## 2023-12-28 DIAGNOSIS — Z3A24 24 weeks gestation of pregnancy: Secondary | ICD-10-CM

## 2023-12-28 DIAGNOSIS — O0990 Supervision of high risk pregnancy, unspecified, unspecified trimester: Secondary | ICD-10-CM

## 2023-12-28 LAB — POCT URINALYSIS DIPSTICK OB
Appearance: NEGATIVE
Bilirubin, UA: NEGATIVE
Blood, UA: NEGATIVE
Glucose, UA: NEGATIVE
Ketones, UA: NEGATIVE
Leukocytes, UA: NEGATIVE
Nitrite, UA: NEGATIVE
POC,PROTEIN,UA: NEGATIVE
Spec Grav, UA: 1.01 (ref 1.010–1.025)
Urobilinogen, UA: 0.2 U/dL
pH, UA: 6.5 (ref 5.0–8.0)

## 2023-12-28 NOTE — Assessment & Plan Note (Addendum)
Feeling well overall.  Reviewed healthy diet and lifestyle for pregnancy.  Reviewed fetal movement and what to expect for the next four weeks. Reviewed red flag warning signs anticipatory guidance for 28 week labs at next visit.

## 2023-12-28 NOTE — Assessment & Plan Note (Addendum)
Normotensive today.  Continue Labetalol 100mg  BID

## 2023-12-28 NOTE — Progress Notes (Signed)
    Return Prenatal Note   Assessment/Plan   Plan  23 y.o. G1P0000 at [redacted]w[redacted]d presents for follow-up OB visit. Reviewed prenatal record including previous visit note.  Chronic hypertension Normotensive today.  Continue Labetalol  100mg  BID  Supervision of high-risk pregnancy Feeling well overall.  Reviewed healthy diet and lifestyle for pregnancy.  Reviewed fetal movement and what to expect for the next four weeks. Reviewed red flag warning signs anticipatory guidance for 28 week labs at next visit.     Orders Placed This Encounter  Procedures   POC Urinalysis Dipstick OB   No follow-ups on file.   Future Appointments  Date Time Provider Department Center  01/24/2024 10:00 AM [facility]  01/25/2024  9:20 AM [facility] None  01/25/2024 10:15 AM Swanson, Eleanor HERO, CNM [facility] None    For next visit:  ROB with 1 hour gluocla, third trimester labs, and Tdap     Subjective   22 y.o. G1P0000 at [redacted]w[redacted]d presents for this follow-up prenatal visit.  Patient with no complaints. Questions from mother in law Patient reports: Movement: Present Contractions: Not present  Objective   Flow sheet Vitals: Pulse Rate: 98 BP: 122/66 Fundal Height: 24 cm Fetal Heart Rate (bpm): 144 Total weight gain: 9 lb (4.082 kg)  General Appearance  No acute distress, well appearing, and well nourished Pulmonary   Normal work of breathing Neurologic   Alert and oriented to person, place, and time Psychiatric   Mood and affect within normal limits  Damien PARSLEY, CNM  12/27/2509:22 AM

## 2023-12-31 ENCOUNTER — Other Ambulatory Visit: Payer: Medicaid Other

## 2023-12-31 ENCOUNTER — Ambulatory Visit: Payer: Medicaid Other

## 2024-01-24 ENCOUNTER — Ambulatory Visit: Admitting: Obstetrics

## 2024-01-24 ENCOUNTER — Ambulatory Visit: Payer: Medicaid Other | Attending: Obstetrics

## 2024-01-24 ENCOUNTER — Other Ambulatory Visit: Payer: Self-pay

## 2024-01-24 ENCOUNTER — Other Ambulatory Visit: Payer: Self-pay | Admitting: Obstetrics

## 2024-01-24 DIAGNOSIS — Z113 Encounter for screening for infections with a predominantly sexual mode of transmission: Secondary | ICD-10-CM

## 2024-01-24 DIAGNOSIS — Z369 Encounter for antenatal screening, unspecified: Secondary | ICD-10-CM

## 2024-01-24 DIAGNOSIS — I10 Essential (primary) hypertension: Secondary | ICD-10-CM

## 2024-01-24 DIAGNOSIS — O99213 Obesity complicating pregnancy, third trimester: Secondary | ICD-10-CM | POA: Insufficient documentation

## 2024-01-24 DIAGNOSIS — E669 Obesity, unspecified: Secondary | ICD-10-CM

## 2024-01-24 DIAGNOSIS — O10013 Pre-existing essential hypertension complicating pregnancy, third trimester: Secondary | ICD-10-CM

## 2024-01-24 DIAGNOSIS — D649 Anemia, unspecified: Secondary | ICD-10-CM

## 2024-01-24 DIAGNOSIS — Z3A28 28 weeks gestation of pregnancy: Secondary | ICD-10-CM | POA: Diagnosis not present

## 2024-01-24 DIAGNOSIS — O10913 Unspecified pre-existing hypertension complicating pregnancy, third trimester: Secondary | ICD-10-CM | POA: Insufficient documentation

## 2024-01-24 DIAGNOSIS — Z131 Encounter for screening for diabetes mellitus: Secondary | ICD-10-CM

## 2024-01-24 DIAGNOSIS — O0992 Supervision of high risk pregnancy, unspecified, second trimester: Secondary | ICD-10-CM

## 2024-01-24 NOTE — Progress Notes (Signed)
 MFM Consult Note  Kathleen Wong is currently at 28 weeks and 1 day.  She was seen due to chronic hypertension treated with labetalol 100 mg twice a day and maternal obesity.  She is currently taking 2 tablets of baby aspirin daily for preeclampsia prophylaxis.  She denies any problems in her current pregnancy.    She had a cell free DNA test earlier in her pregnancy which indicated a low risk for trisomy 53, 33, and 13. A female fetus is predicted.   She was informed that the fetal growth and amniotic fluid level were appropriate for her gestational age.   There were no obvious fetal anomalies noted on today's ultrasound exam.  However, today's exam was limited due to her advanced gestational age.  The patient was informed that anomalies may be missed due to technical limitations. If the fetus is in a suboptimal position or maternal habitus is increased, visualization of the fetus in the maternal uterus may be impaired.  The following were discussed during today's consultation:  Chronic hypertension in pregnancy  She was advised to continue taking labetalol for the duration of her pregnancy for blood pressure control.    The increased risk of superimposed preeclampsia associated with chronic hypertension in pregnancy was discussed.    She was advised to continue taking the 2 tablets of baby aspirin daily for preeclampsia prophylaxis.    Due to chronic hypertension, we will start weekly fetal testing at 32 weeks.  Should her blood pressure be within normal limits and she does not develop preeclampsia, delivery is recommended at around 39 weeks.    Should her blood pressures be difficult to control and she does not develop preeclampsia, delivery may be considered at around 37 weeks.  Should she develop preeclampsia, delivery would be recommended at any time at 34 weeks or greater.    The patient will return in 4 weeks for a growth scan and BPP.    The patient stated that all of her  questions were answered today.  A total of 45 minutes was spent counseling and coordinating the care for this patient.  Greater than 50% of the time was spent in direct face-to-face contact.

## 2024-01-25 ENCOUNTER — Encounter: Payer: Medicaid Other | Admitting: Obstetrics

## 2024-01-25 ENCOUNTER — Other Ambulatory Visit: Payer: Medicaid Other

## 2024-01-25 DIAGNOSIS — Z23 Encounter for immunization: Secondary | ICD-10-CM

## 2024-01-25 DIAGNOSIS — O0990 Supervision of high risk pregnancy, unspecified, unspecified trimester: Secondary | ICD-10-CM

## 2024-01-25 DIAGNOSIS — I10 Essential (primary) hypertension: Secondary | ICD-10-CM

## 2024-01-25 DIAGNOSIS — R030 Elevated blood-pressure reading, without diagnosis of hypertension: Secondary | ICD-10-CM

## 2024-01-27 ENCOUNTER — Other Ambulatory Visit

## 2024-01-27 ENCOUNTER — Encounter: Admitting: Advanced Practice Midwife

## 2024-01-27 ENCOUNTER — Ambulatory Visit (INDEPENDENT_AMBULATORY_CARE_PROVIDER_SITE_OTHER): Admitting: Certified Nurse Midwife

## 2024-01-27 VITALS — BP 133/86 | HR 70 | Wt 244.0 lb

## 2024-01-27 DIAGNOSIS — D649 Anemia, unspecified: Secondary | ICD-10-CM

## 2024-01-27 DIAGNOSIS — Z113 Encounter for screening for infections with a predominantly sexual mode of transmission: Secondary | ICD-10-CM

## 2024-01-27 DIAGNOSIS — O0993 Supervision of high risk pregnancy, unspecified, third trimester: Secondary | ICD-10-CM

## 2024-01-27 DIAGNOSIS — I10 Essential (primary) hypertension: Secondary | ICD-10-CM

## 2024-01-27 DIAGNOSIS — Z3A28 28 weeks gestation of pregnancy: Secondary | ICD-10-CM | POA: Diagnosis not present

## 2024-01-27 DIAGNOSIS — Z131 Encounter for screening for diabetes mellitus: Secondary | ICD-10-CM

## 2024-01-27 NOTE — Assessment & Plan Note (Signed)
 28 week labs done today.  TDAP Reviewed kick counts and preterm labor warning signs. Instructed to call office or come to hospital with persistent headache, vision changes, regular contractions, leaking of fluid, decreased fetal movement or vaginal bleeding.

## 2024-01-27 NOTE — Assessment & Plan Note (Addendum)
 MFM recommendations Weekly antenatal testing starting 32 weeks - already scheduled Growth q 4 weeks Delivery at 39 weeks with well controlled BP

## 2024-01-27 NOTE — Progress Notes (Signed)
    Return Prenatal Note   Assessment/Plan   Plan  23 y.o. G1P0000 at [redacted]w[redacted]d presents for follow-up OB visit. Reviewed prenatal record including previous visit note.  Chronic hypertension MFM recommendations Weekly antenatal testing starting 32 weeks - already scheduled Growth q 4 weeks Delivery at 39 weeks with well controlled BP  Supervision of high-risk pregnancy 28 week labs done today.  TDAP Reviewed kick counts and preterm labor warning signs. Instructed to call office or come to hospital with persistent headache, vision changes, regular contractions, leaking of fluid, decreased fetal movement or vaginal bleeding.    No orders of the defined types were placed in this encounter.  No follow-ups on file.   Future Appointments  Date Time Provider Department Center  02/10/2024  2:55 PM Julieanne Manson, MD [facility] None  02/21/2024  4:00 PM [facility]  02/28/2024  3:00 PM [facility]  03/06/2024  3:00 PM [facility]  03/13/2024  3:00 PM [facility]    For next visit:  continue with routine prenatal care     Subjective   22 y.o. G1P0000 at [redacted]w[redacted]d presents for this follow-up prenatal visit.  Patient with questions about timing of delivery Patient reports: Movement: Present Contractions: Not present  Objective   Flow sheet Vitals: Pulse Rate: 70 BP: 133/86 Fundal Height: 30 cm Fetal Heart Rate (bpm): 150 Total weight gain: 19 lb (8.618 kg)  General Appearance  No acute distress, well appearing, and well nourished Pulmonary   Normal work of breathing Neurologic   Alert and oriented to person, place, and time Psychiatric   Mood and affect within normal limits  Oley Balm, CNM  01/26/2510:17 AM

## 2024-01-28 LAB — 28 WEEK RH+PANEL
Basophils Absolute: 0 10*3/uL (ref 0.0–0.2)
Basos: 0 %
EOS (ABSOLUTE): 0 10*3/uL (ref 0.0–0.4)
Eos: 0 %
Gestational Diabetes Screen: 104 mg/dL (ref 70–139)
HIV Screen 4th Generation wRfx: NONREACTIVE
Hematocrit: 40.4 % (ref 34.0–46.6)
Hemoglobin: 13.2 g/dL (ref 11.1–15.9)
Immature Grans (Abs): 0.1 10*3/uL (ref 0.0–0.1)
Immature Granulocytes: 1 %
Lymphocytes Absolute: 1.5 10*3/uL (ref 0.7–3.1)
Lymphs: 14 %
MCH: 29.1 pg (ref 26.6–33.0)
MCHC: 32.7 g/dL (ref 31.5–35.7)
MCV: 89 fL (ref 79–97)
Monocytes Absolute: 0.6 10*3/uL (ref 0.1–0.9)
Monocytes: 5 %
Neutrophils Absolute: 8.3 10*3/uL — ABNORMAL HIGH (ref 1.4–7.0)
Neutrophils: 80 %
Platelets: 195 10*3/uL (ref 150–450)
RBC: 4.53 x10E6/uL (ref 3.77–5.28)
RDW: 13.3 % (ref 11.7–15.4)
RPR Ser Ql: NONREACTIVE
WBC: 10.5 10*3/uL (ref 3.4–10.8)

## 2024-01-29 ENCOUNTER — Encounter: Payer: Self-pay | Admitting: Obstetrics

## 2024-02-10 ENCOUNTER — Ambulatory Visit: Admitting: Obstetrics

## 2024-02-10 VITALS — BP 126/83 | HR 124 | Wt 248.0 lb

## 2024-02-10 DIAGNOSIS — O99213 Obesity complicating pregnancy, third trimester: Secondary | ICD-10-CM | POA: Diagnosis not present

## 2024-02-10 DIAGNOSIS — Z3A30 30 weeks gestation of pregnancy: Secondary | ICD-10-CM

## 2024-02-10 DIAGNOSIS — O10013 Pre-existing essential hypertension complicating pregnancy, third trimester: Secondary | ICD-10-CM | POA: Diagnosis not present

## 2024-02-10 DIAGNOSIS — O10919 Unspecified pre-existing hypertension complicating pregnancy, unspecified trimester: Secondary | ICD-10-CM

## 2024-02-10 DIAGNOSIS — O0993 Supervision of high risk pregnancy, unspecified, third trimester: Secondary | ICD-10-CM

## 2024-02-10 DIAGNOSIS — R7401 Elevation of levels of liver transaminase levels: Secondary | ICD-10-CM

## 2024-02-10 NOTE — Progress Notes (Signed)
    Return Prenatal Note   Subjective  23 y.o. G1P0000 at [redacted]w[redacted]d presents for this follow-up prenatal visit. Pregnancy notable for cHTN and BMI.   Patient here with her mother, reports people around her tell her she's breathing heavier, but she doesn't notice and doesn't feel any symptoms. Is taking her daily Labetalol and denies any symptoms of preeclampsia.   Patient reports: Movement: Present Contractions: Not present Denies vaginal bleeding or leaking fluid. Objective  Flow sheet Vitals: Pulse Rate: (!) 124 BP: 126/83 Fundal Height: 31 cm Fetal Heart Rate (bpm): 140 Total weight gain: 23 lb (10.4 kg)  General Appearance  No acute distress, well appearing, and well nourished Pulmonary   Normal work of breathing, CTAB Neurologic   Alert and oriented to person, place, and time Psychiatric   Mood and affect within normal limits  Assessment/Plan   Plan  22 y.o. G1P0000 at [redacted]w[redacted]d by 6wk [place] presents for follow-up OB visit. Reviewed prenatal record including previous visit note.  1. Supervision of high risk pregnancy in third trimester (Primary) -Discussed physiologic changes in 3rd trimester and reassurance that her more shallow breathing is normal due to uterine compression on lungs.  2. Chronic pre-existing hypertension during pregnancy -BP 126/83 today, normal. Continue Labetalol 100mg  BID -Continue ASA 162 daily to preeclampsia prevention -Weekly BPPs at 32wks, Q4wk growth [place]; scheduled with MFM -Per MFM, delivery at 39wks if BPs remain well-controlled; if BP worsening, IOL at 37wk  3. Severe obesity due to excess calories affecting pregnancy, antepartum (HCC) -TWG now 23lbs -- limit if possible, which can help HTN and reduce risk of developing preeclampsia -BMI 41.3, anesthesia consult if >=45  3. Transaminitis - Repeat LFTs in 2 wks at next visit for mildly-elevated ALT (44) on NOB labs, ordered   Orders Placed This Encounter  Procedures   Hepatic function panel     Standing Status:   Future    Expected Date:   02/24/2024    Expiration Date:   02/09/2025   Return in about 2 weeks (around 02/24/2024) for ROB.   Future Appointments  Date Time Provider Department Center  02/21/2024  4:00 PM [facility]  02/24/2024  2:55 PM Hildred Laser, MD [facility] None  02/28/2024  3:00 PM [facility]  03/06/2024  3:00 PM [facility]  03/13/2024  3:00 PM [facility]    For next visit:  continue with routine prenatal care    Julieanne Manson, DO [facility] [place]

## 2024-02-21 ENCOUNTER — Other Ambulatory Visit

## 2024-02-24 ENCOUNTER — Encounter: Payer: Self-pay | Admitting: Obstetrics and Gynecology

## 2024-02-24 ENCOUNTER — Ambulatory Visit (INDEPENDENT_AMBULATORY_CARE_PROVIDER_SITE_OTHER): Admitting: Obstetrics and Gynecology

## 2024-02-24 VITALS — BP 134/81 | HR 102 | Wt 248.4 lb

## 2024-02-24 DIAGNOSIS — O99213 Obesity complicating pregnancy, third trimester: Secondary | ICD-10-CM | POA: Insufficient documentation

## 2024-02-24 DIAGNOSIS — Z3A32 32 weeks gestation of pregnancy: Secondary | ICD-10-CM | POA: Diagnosis not present

## 2024-02-24 DIAGNOSIS — R7401 Elevation of levels of liver transaminase levels: Secondary | ICD-10-CM | POA: Insufficient documentation

## 2024-02-24 DIAGNOSIS — O0993 Supervision of high risk pregnancy, unspecified, third trimester: Secondary | ICD-10-CM

## 2024-02-24 DIAGNOSIS — O10013 Pre-existing essential hypertension complicating pregnancy, third trimester: Secondary | ICD-10-CM | POA: Diagnosis not present

## 2024-02-24 DIAGNOSIS — O10919 Unspecified pre-existing hypertension complicating pregnancy, unspecified trimester: Secondary | ICD-10-CM

## 2024-02-24 DIAGNOSIS — O99210 Obesity complicating pregnancy, unspecified trimester: Secondary | ICD-10-CM

## 2024-02-24 DIAGNOSIS — E669 Obesity, unspecified: Secondary | ICD-10-CM

## 2024-02-24 NOTE — Progress Notes (Signed)
 ROB: Patient is a 23 y.o. G1P0000 at [redacted]w[redacted]d who presents for routine OB care.  Pregnancy is complicated by: has Supervision of high-risk pregnancy; and Chronic pre-existing hypertension during pregnancy  Patient denies complaints.  Notes good fetal movement. Compliant with BP meds. Elevate ALT in 1st trimester, to repeat today.  Is scheduled to begin antenatal testing with BPPs with MFM starting next week.  NST performed today was reviewed and was found to be reactive.  Continue recommended antenatal testing and prenatal care.  Discussed plans for labor, desires epidural. Has questions regarding best methods for contraception while breastfeeding. Reviewed all available options. Will use  Peds. RTC in 2 weeks.    Fetus A Non-Stress Test Interpretation for 02/24/24  Indication: Chronic Hypertenstion and Obesity  Fetal Heart Rate A Mode: External Baseline Rate (A): 130 bpm Variability: Moderate Accelerations: 15 x 15 Decelerations: None Multiple birth?: No  Uterine Activity Contraction Frequency (min): none  Interpretation (Fetal Testing) Nonstress Test Interpretation: Reactive Overall Impression: Reassuring for gestational age

## 2024-02-24 NOTE — Progress Notes (Signed)
 ROB [redacted]w[redacted]d: She is doing well. She reports good fetal movement and has no new concerns today.

## 2024-02-25 LAB — HEPATIC FUNCTION PANEL
ALT: 46 IU/L — ABNORMAL HIGH (ref 0–32)
AST: 30 IU/L (ref 0–40)
Albumin: 3.7 g/dL — ABNORMAL LOW (ref 4.0–5.0)
Alkaline Phosphatase: 189 IU/L — ABNORMAL HIGH (ref 44–121)
Bilirubin Total: 0.2 mg/dL (ref 0.0–1.2)
Bilirubin, Direct: 0.1 mg/dL (ref 0.00–0.40)
Total Protein: 6.1 g/dL (ref 6.0–8.5)

## 2024-02-28 ENCOUNTER — Ambulatory Visit: Admitting: Obstetrics

## 2024-02-28 ENCOUNTER — Other Ambulatory Visit: Payer: Self-pay | Admitting: Obstetrics

## 2024-02-28 ENCOUNTER — Other Ambulatory Visit: Payer: Self-pay

## 2024-02-28 ENCOUNTER — Ambulatory Visit: Attending: Obstetrics

## 2024-02-28 VITALS — BP 125/79 | HR 91

## 2024-02-28 DIAGNOSIS — O0993 Supervision of high risk pregnancy, unspecified, third trimester: Secondary | ICD-10-CM

## 2024-02-28 DIAGNOSIS — O99213 Obesity complicating pregnancy, third trimester: Secondary | ICD-10-CM | POA: Insufficient documentation

## 2024-02-28 DIAGNOSIS — Z362 Encounter for other antenatal screening follow-up: Secondary | ICD-10-CM | POA: Insufficient documentation

## 2024-02-28 DIAGNOSIS — E669 Obesity, unspecified: Secondary | ICD-10-CM

## 2024-02-28 DIAGNOSIS — Z3A33 33 weeks gestation of pregnancy: Secondary | ICD-10-CM | POA: Diagnosis not present

## 2024-02-28 DIAGNOSIS — O10013 Pre-existing essential hypertension complicating pregnancy, third trimester: Secondary | ICD-10-CM

## 2024-02-28 DIAGNOSIS — Z79899 Other long term (current) drug therapy: Secondary | ICD-10-CM | POA: Insufficient documentation

## 2024-02-28 DIAGNOSIS — O10919 Unspecified pre-existing hypertension complicating pregnancy, unspecified trimester: Secondary | ICD-10-CM

## 2024-02-28 DIAGNOSIS — O10913 Unspecified pre-existing hypertension complicating pregnancy, third trimester: Secondary | ICD-10-CM

## 2024-02-28 NOTE — Progress Notes (Signed)
 MFM Consult Note  Kathleen Wong is currently at 33 weeks and 1 day.  She has been followed due to chronic hypertension treated with labetalol and maternal obesity with a BMI of 38.    She denies any problems since her last exam.  Her blood pressure today was 125/79.  On today's exam, the overall EFW of 5 pounds 6 ounces measures at the 80th percentile for her gestational age.  There was normal amniotic fluid noted with a total AFI of 8.94 cm.  A BPP performed today was 8 out of 8.  Due to chronic hypertension, we will continue to follow her with weekly fetal testing until delivery.    Should her blood pressures remain within normal limits, delivery will be recommended at around 39 weeks.    Delivery prior to 39 weeks may be recommended should her blood pressures be elevated despite treatment with labetalol or should she develop preeclampsia.    She will return in 1 week for another BPP.    The patient stated that all of her questions were answered today.  A total of 20 minutes was spent counseling and coordinating the care for this patient.  Greater than 50% of the time was spent in direct face-to-face contact.

## 2024-03-06 ENCOUNTER — Other Ambulatory Visit: Payer: Self-pay | Admitting: Obstetrics

## 2024-03-06 ENCOUNTER — Ambulatory Visit: Admitting: Obstetrics

## 2024-03-06 ENCOUNTER — Ambulatory Visit: Attending: Obstetrics

## 2024-03-06 ENCOUNTER — Other Ambulatory Visit: Payer: Self-pay

## 2024-03-06 DIAGNOSIS — Z3A33 33 weeks gestation of pregnancy: Secondary | ICD-10-CM | POA: Diagnosis not present

## 2024-03-06 DIAGNOSIS — Z3A34 34 weeks gestation of pregnancy: Secondary | ICD-10-CM | POA: Insufficient documentation

## 2024-03-06 DIAGNOSIS — O10913 Unspecified pre-existing hypertension complicating pregnancy, third trimester: Secondary | ICD-10-CM

## 2024-03-06 DIAGNOSIS — O99213 Obesity complicating pregnancy, third trimester: Secondary | ICD-10-CM | POA: Insufficient documentation

## 2024-03-06 DIAGNOSIS — E669 Obesity, unspecified: Secondary | ICD-10-CM | POA: Diagnosis not present

## 2024-03-06 DIAGNOSIS — O10013 Pre-existing essential hypertension complicating pregnancy, third trimester: Secondary | ICD-10-CM

## 2024-03-06 DIAGNOSIS — Z3689 Encounter for other specified antenatal screening: Secondary | ICD-10-CM | POA: Insufficient documentation

## 2024-03-06 NOTE — Progress Notes (Signed)
 MFM Consult Note  Kathleen Wong is currently at 33 weeks and 1 day.  She has been followed due to chronic hypertension treated with labetalol and maternal obesity with a BMI of 38.    She denies any problems since her last exam.  Her blood pressure today was 132/86.    There was normal amniotic fluid noted today with a total AFI of 11.51 cm.  A BPP performed today was 8 out of 8.  Due to chronic hypertension, we will continue to follow her with weekly fetal testing until delivery.    Should her blood pressures remain within normal limits, delivery will be recommended at around 39 weeks.    Delivery prior to 39 weeks may be recommended should her blood pressures be elevated despite treatment with labetalol or should she develop preeclampsia.   She will return in 1 week for another BPP.    The patient stated that all of her questions were answered today.  A total of 10 minutes was spent counseling and coordinating the care for this patient.  Greater than 50% of the time was spent in direct face-to-face contact.

## 2024-03-08 NOTE — Addendum Note (Signed)
 Addended by: Gennett Kiang on: 03/08/2024 01:54 PM   Modules accepted: Level of Service

## 2024-03-09 ENCOUNTER — Encounter

## 2024-03-13 ENCOUNTER — Ambulatory Visit (INDEPENDENT_AMBULATORY_CARE_PROVIDER_SITE_OTHER): Admitting: Certified Nurse Midwife

## 2024-03-13 ENCOUNTER — Ambulatory Visit: Admitting: Maternal & Fetal Medicine

## 2024-03-13 ENCOUNTER — Ambulatory Visit: Attending: Maternal & Fetal Medicine

## 2024-03-13 ENCOUNTER — Encounter: Payer: Self-pay | Admitting: Certified Nurse Midwife

## 2024-03-13 ENCOUNTER — Other Ambulatory Visit: Payer: Self-pay

## 2024-03-13 VITALS — BP 124/83 | HR 114

## 2024-03-13 VITALS — BP 130/80 | HR 101 | Wt 253.6 lb

## 2024-03-13 DIAGNOSIS — O26893 Other specified pregnancy related conditions, third trimester: Secondary | ICD-10-CM | POA: Diagnosis not present

## 2024-03-13 DIAGNOSIS — E669 Obesity, unspecified: Secondary | ICD-10-CM | POA: Diagnosis not present

## 2024-03-13 DIAGNOSIS — Z3A35 35 weeks gestation of pregnancy: Secondary | ICD-10-CM | POA: Diagnosis not present

## 2024-03-13 DIAGNOSIS — O10919 Unspecified pre-existing hypertension complicating pregnancy, unspecified trimester: Secondary | ICD-10-CM

## 2024-03-13 DIAGNOSIS — O10913 Unspecified pre-existing hypertension complicating pregnancy, third trimester: Secondary | ICD-10-CM | POA: Diagnosis present

## 2024-03-13 DIAGNOSIS — Z362 Encounter for other antenatal screening follow-up: Secondary | ICD-10-CM | POA: Diagnosis not present

## 2024-03-13 DIAGNOSIS — O10013 Pre-existing essential hypertension complicating pregnancy, third trimester: Secondary | ICD-10-CM | POA: Insufficient documentation

## 2024-03-13 DIAGNOSIS — R7401 Elevation of levels of liver transaminase levels: Secondary | ICD-10-CM | POA: Insufficient documentation

## 2024-03-13 DIAGNOSIS — O99213 Obesity complicating pregnancy, third trimester: Secondary | ICD-10-CM | POA: Diagnosis not present

## 2024-03-13 DIAGNOSIS — Z3689 Encounter for other specified antenatal screening: Secondary | ICD-10-CM | POA: Diagnosis not present

## 2024-03-13 DIAGNOSIS — O0993 Supervision of high risk pregnancy, unspecified, third trimester: Secondary | ICD-10-CM

## 2024-03-13 NOTE — Progress Notes (Signed)
 ROB doing well, feeling good movement. She has MFM appointment today at 3pm for bpp.  Discussed gbs testing next week. She is in agreement. She denies symptoms of pre eclampsia today. She does have some mild pitting edema in both feet. Discussed self help measures. Discussed scheduling her induction in a few weeks for the 39th week of pregnancy. She is in agreement.   Follow up 1 wk for ROB.  Alise Appl, CNM

## 2024-03-13 NOTE — Progress Notes (Signed)
   Patient information  Patient Name: Kathleen Wong  Patient MRN:   [id number]  Referring practice: MFM Referring Provider: Willaim Harlem  MFM CONSULT  Kathleen Wong is a 23 y.o. G1P0000 at [redacted]w[redacted]d here for ultrasound and consultation. Patient Active Problem List   Diagnosis Date Noted   Severe obesity due to excess calories affecting pregnancy, antepartum (HCC) 02/24/2024   Transaminitis 02/24/2024   Chronic pre-existing hypertension during pregnancy 11/05/2023   Supervision of high-risk pregnancy 08/17/2023    Kathleen Wong is doing well today with no acute concerns.  RE CHTN: BP is controlled with 100 mg of Labetalol . She denies headaches or visual disturbances. I discussed the signs/symptoms of preeclampsia and delivery timing (likely 39 weeks) as well as standard OB precautions (fetal movement, labor, etc). She will continue to have antenatal testing weekly. Discussed BP goal of < 140/90.   Sonographic findings Single intrauterine pregnancy. Fetal cardiac activity: Observed. Presentation: Cephalic. Interval fetal anatomy appears normal. Amniotic fluid: Within normal limits.  MVP: 3.57 cm. Placenta: Anterior. BPP: 8/8.   There are limitations of prenatal ultrasound such as the inability to detect certain abnormalities due to poor visualization. Various factors such as fetal position, gestational age and maternal body habitus may increase the difficulty in visualizing the fetal anatomy.    Recommendations -Serial growth ultrasounds every 4 weeks until delivery -Weekly antenatal testing  -Delivery around [redacted] weeks gestation or sooner if indicated -Standard OB precautions give -BP goal < 140/90  Review of Systems: A review of systems was performed and was negative except per HPI   Vitals and Physical Exam    03/13/2024    2:28 PM 03/13/2024    1:55 PM 03/06/2024    3:12 PM  Vitals with BMI  Weight  253 lbs 10 oz   Systolic 124 130 409  Diastolic 83 80 58  Pulse  114 811 86    Sitting comfortably on the sonogram table Nonlabored breathing Normal rate and rhythm Abdomen is nontender  Past pregnancies OB History  Gravida Para Term Preterm AB Living  1 0 0 0 0 0  SAB IAB Ectopic Multiple Live Births  0 0 0 0 0    # Outcome Date GA Lbr Len/2nd Weight Sex Type Anes PTL Lv  1 Current              I spent 20 minutes reviewing the patients chart, including labs and images as well as counseling the patient about her medical conditions. Greater than 50% of the time was spent in direct face-to-face patient counseling.  Kathleen Wong  MFM, [facility]   03/13/2024  3:18 PM

## 2024-03-13 NOTE — Patient Instructions (Signed)

## 2024-03-18 ENCOUNTER — Observation Stay
Admission: EM | Admit: 2024-03-18 | Discharge: 2024-03-18 | Disposition: A | Discharge status: Home / Self Care | Attending: Obstetrics and Gynecology | Admitting: Obstetrics and Gynecology

## 2024-03-18 ENCOUNTER — Other Ambulatory Visit: Payer: Self-pay

## 2024-03-18 ENCOUNTER — Encounter: Payer: Self-pay | Admitting: Obstetrics and Gynecology

## 2024-03-18 DIAGNOSIS — O163 Unspecified maternal hypertension, third trimester: Secondary | ICD-10-CM | POA: Diagnosis not present

## 2024-03-18 DIAGNOSIS — O368330 Maternal care for abnormalities of the fetal heart rate or rhythm, third trimester, not applicable or unspecified: Secondary | ICD-10-CM | POA: Insufficient documentation

## 2024-03-18 DIAGNOSIS — O10013 Pre-existing essential hypertension complicating pregnancy, third trimester: Secondary | ICD-10-CM | POA: Insufficient documentation

## 2024-03-18 DIAGNOSIS — O113 Pre-existing hypertension with pre-eclampsia, third trimester: Principal | ICD-10-CM | POA: Insufficient documentation

## 2024-03-18 DIAGNOSIS — Z3A35 35 weeks gestation of pregnancy: Secondary | ICD-10-CM | POA: Insufficient documentation

## 2024-03-18 DIAGNOSIS — O0993 Supervision of high risk pregnancy, unspecified, third trimester: Principal | ICD-10-CM

## 2024-03-18 DIAGNOSIS — Z7901 Long term (current) use of anticoagulants: Secondary | ICD-10-CM | POA: Insufficient documentation

## 2024-03-18 DIAGNOSIS — O10919 Unspecified pre-existing hypertension complicating pregnancy, unspecified trimester: Secondary | ICD-10-CM

## 2024-03-18 HISTORY — DX: Essential (primary) hypertension: I10

## 2024-03-18 LAB — CBC WITH DIFFERENTIAL/PLATELET
Abs Immature Granulocytes: 0.08 10*3/uL — ABNORMAL HIGH (ref 0.00–0.07)
Basophils Absolute: 0 10*3/uL (ref 0.0–0.1)
Basophils Relative: 0 %
Eosinophils Absolute: 0.2 10*3/uL (ref 0.0–0.5)
Eosinophils Relative: 2 %
HCT: 38.7 % (ref 36.0–46.0)
Hemoglobin: 12.9 g/dL (ref 12.0–15.0)
Immature Granulocytes: 1 %
Lymphocytes Relative: 21 %
Lymphs Abs: 2.3 10*3/uL (ref 0.7–4.0)
MCH: 29.9 pg (ref 26.0–34.0)
MCHC: 33.3 g/dL (ref 30.0–36.0)
MCV: 89.8 fL (ref 80.0–100.0)
Monocytes Absolute: 1.2 10*3/uL — ABNORMAL HIGH (ref 0.1–1.0)
Monocytes Relative: 11 %
Neutro Abs: 7.4 10*3/uL (ref 1.7–7.7)
Neutrophils Relative %: 65 %
Platelets: 166 10*3/uL (ref 150–400)
RBC: 4.31 MIL/uL (ref 3.87–5.11)
RDW: 13.9 % (ref 11.5–15.5)
WBC: 11.3 10*3/uL — ABNORMAL HIGH (ref 4.0–10.5)
nRBC: 0 % (ref 0.0–0.2)

## 2024-03-18 LAB — COMPREHENSIVE METABOLIC PANEL WITH GFR
ALT: 43 U/L (ref 0–44)
AST: 30 U/L (ref 15–41)
Albumin: 2.9 g/dL — ABNORMAL LOW (ref 3.5–5.0)
Alkaline Phosphatase: 138 U/L — ABNORMAL HIGH (ref 38–126)
Anion gap: 10 (ref 5–15)
BUN: 8 mg/dL (ref 6–20)
CO2: 22 mmol/L (ref 22–32)
Calcium: 9.2 mg/dL (ref 8.9–10.3)
Chloride: 106 mmol/L (ref 98–111)
Creatinine, Ser: 0.62 mg/dL (ref 0.44–1.00)
GFR, Estimated: >60 mL/min (ref >60)
Glucose, Bld: 90 mg/dL (ref 70–99)
Potassium: 3.5 mmol/L (ref 3.5–5.1)
Sodium: 138 mmol/L (ref 135–145)
Total Bilirubin: 0.4 mg/dL (ref 0.0–1.2)
Total Protein: 6.4 g/dL — ABNORMAL LOW (ref 6.5–8.1)

## 2024-03-18 LAB — PROTEIN / CREATININE RATIO, URINE
Creatinine, Urine: 92 mg/dL
Protein Creatinine Ratio: 0.22 mg/mg{creat} — ABNORMAL HIGH (ref 0.00–0.15)
Total Protein, Urine: 20 mg/dL

## 2024-03-18 MED ORDER — ALBUTEROL SULFATE (2.5 MG/3ML) 0.083% IN NEBU: 2.5000 mg | INHALATION_SOLUTION | RESPIRATORY_TRACT | 12 refills | Status: AC | PRN

## 2024-03-18 MED ORDER — ALBUTEROL SULFATE (2.5 MG/3ML) 0.083% IN NEBU: 2.5000 mg | INHALATION_SOLUTION | RESPIRATORY_TRACT | Status: DC | PRN

## 2024-03-18 NOTE — OB Triage Note (Signed)
 Pt reports increasing swelling over the past few weeks. Denies HA, visual disturbances, R upper gastric pain. Denies vaginal bleeding, LOF. Kathleen Wong

## 2024-03-18 NOTE — Final Consult Note (Signed)
 OB/Triage Note  Patient ID: Kathleen Wong MRN: [id number] DOB/AGE: 14-Oct-2001 22 y.o.  Subjective  History of Present Illness: The patient is a 23 y.o. female G1P0000 at [redacted]w[redacted]d who presents for concerns of preeclampsia. [place] was diagnosed with CHTN and started on 100mg  BID of labetalol  at [redacted] weeks pregnant. Reports increased swelling of her hands and feet over the past few days, swelling felt pretty sudden. Also reports some numbness/tingling to several finger. Denies HA, visual changes, RUQ pain. She is worried that the swelling is a sign of preeclampsia.Also reports coughing lately, thinks its from allergies but states she has a  history of asthma, has not used an inhaler in sometime.   Past Medical History:  Diagnosis Date   Asthma    as a child   Hypertension     Past Surgical History:  Procedure Laterality Date   NO PAST SURGERIES      No current facility-administered medications on file prior to encounter.   Current Outpatient Medications on File Prior to Encounter  Medication Sig Dispense Refill   aspirin  EC 81 MG tablet Take 2 tablets (162 mg total) by mouth daily. Swallow whole. 30 tablet 12   cetirizine (ZYRTEC) 10 MG chewable tablet Chew 10 mg by mouth daily.     labetalol  (NORMODYNE ) 100 MG tablet Take 1 tablet (100 mg total) by mouth 2 (two) times daily. 60 tablet 6   Prenatal Vit-Fe Fumarate-FA (MULTIVITAMIN-PRENATAL) 27-0.8 MG TABS tablet Take 1 tablet by mouth daily at 12 noon. 30 tablet 11    No Known Allergies  Social History   Socioeconomic History   Marital status: Single    Spouse name: Not on file   Number of children: 0   Years of education: 13   Highest education level: Not on file  Occupational History   Occupation: unemployed  Tobacco Use   Smoking status: Never   Smokeless tobacco: Never  Vaping Use   Vaping status: Former   Quit date: 07/19/2023  Substance and Sexual Activity   Alcohol use: Not Currently   Drug use: Never    Sexual activity: Yes    Partners: Male    Birth control/protection: Implant  Other Topics Concern   Not on file  Social History Narrative   Not on file   Social Drivers of Health   Financial Resource Strain: Low Risk  (08/17/2023)   Overall Financial Resource Strain (CARDIA)    Difficulty of Paying Living Expenses: Not very hard  Food Insecurity: No Food Insecurity (08/17/2023)   Hunger Vital Sign    Worried About Running Out of Food in the Last Year: Never true    Ran Out of Food in the Last Year: Never true  Transportation Needs: No Transportation Needs (08/17/2023)   PRAPARE - Administrator, Civil Service (Medical): No    Lack of Transportation (Non-Medical): No  Physical Activity: Inactive (08/17/2023)   Exercise Vital Sign    Days of Exercise per Week: 0 days    Minutes of Exercise per Session: 0 min  Stress: No Stress Concern Present (08/17/2023)   [employer] of Occupational Health - Occupational Stress Questionnaire    Feeling of Stress : Not at all  Social Connections: Moderately Isolated (08/17/2023)   Social Connection and Isolation Panel [NHANES]    Frequency of Communication with Friends and Family: More than three times a week    Frequency of Social Gatherings with Friends and Family: Twice a week  Attends Religious Services: Never    Active Member of Clubs or Organizations: No    Attends Banker Meetings: Never    Marital Status: Living with partner  Intimate Partner Violence: Not At Risk (08/17/2023)   Humiliation, Afraid, Rape, and Kick questionnaire    Fear of Current or Ex-Partner: No    Emotionally Abused: No    Physically Abused: No    Sexually Abused: No    Family History  Problem Relation Age of Onset   Healthy Mother    Healthy Father    Healthy Sister    Healthy Sister    Healthy Brother    Healthy Maternal Grandmother    Healthy Maternal Grandfather    Heart attack Paternal Grandmother    Healthy Paternal  Grandfather    Autism Maternal Uncle      ROS    Objective  Physical Exam: BP (!) 131/52   Pulse 99   Temp 98 F (36.7 C) (Oral)   Resp 18   Ht 5\' 5"  (1.651 m)   Wt 115 kg   LMP 06/24/2023 (Within Days)   SpO2 96%   BMI 42.20 kg/m   OBGyn Exam  FHT 140, mod variability, pos accels, no decels Toco no regular contractions detect  Lungs: slight wheezing to LUL, other lobes clear Heart: S1S2, RRR Extremities: feet +2/+2 pitting edema, cap refill WNL, pedal pulses +2/+2, hands appear WNL Abd: gravid, non tender  Significant Findings/ Diagnostic Studies: CMP, CBC and PC ratio WNL.    Hospital Course: The patient was admitted to [facility] for observation. Had reactive NST. Preeclampsia labs WNL. Reassurance provided, discussed s/sx of preeclamspia. She has a MFM and regular ROB appointment this week and will have her blood pressure checked at those visits.   Assessment: 23 y.o. female G1P0000 at [redacted]w[redacted]d  CHTN- preeclampsia labs negative RNST  Plan: Discharge home Follow up with MFM and your regular OB next week as previously scheduled Teaching: warning s/sx of PreE  Discharge Instructions     Discharge activity:  No Restrictions   Complete by: As directed    Discharge diet:  No restrictions   Complete by: As directed    Discharge instructions   Complete by: As directed    Follow up with your OB provider at your next ROB   LABOR:  When conractions begin, you should start to time them from the beginning of one contraction to the beginning  of the next.  When contractions are 5 - 10 minutes apart or less and have been regular for at least an hour, you should call your health care provider.   Complete by: As directed    No sexual activity restrictions   Complete by: As directed    Notify physician for a general feeling that "something is not right"   Complete by: As directed    Notify physician for bleeding from the vagina   Complete by: As directed    Notify  physician for blurring of vision or spots before the eyes   Complete by: As directed    Notify physician for chills or fever   Complete by: As directed    Notify physician for fainting spells, "black outs" or loss of consciousness   Complete by: As directed    Notify physician for increase in vaginal discharge   Complete by: As directed    Notify physician for increase or change in vaginal discharge   Complete by: As directed    Notify  physician for intestinal cramps, with or without diarrhea, sometimes described as "gas pain"   Complete by: As directed    Notify physician for leaking of fluid   Complete by: As directed    Notify physician for leaking of fluid   Complete by: As directed    Notify physician for low, dull backache, unrelieved by heat or Tylenol    Complete by: As directed    Notify physician for menstrual like cramps   Complete by: As directed    Notify physician for pain or burning when urinating   Complete by: As directed    Notify physician for pelvic pressure   Complete by: As directed    Notify physician for pelvic pressure (sudden increase)   Complete by: As directed    Notify physician for severe or continued nausea or vomiting   Complete by: As directed    Notify physician for sudden gushing of fluid from the vagina (with or without continued leaking)   Complete by: As directed    Notify physician for sudden, constant, or occasional abdominal pain   Complete by: As directed    Notify physician for uterine contractions.  These may be painless and feel like the uterus is tightening or the baby is  "balling up"   Complete by: As directed    Notify physician for vaginal bleeding   Complete by: As directed    Notify physician if baby moving less than usual   Complete by: As directed    PRETERM LABOR:  Includes any of the follwing symptoms that occur between 20 - [redacted] weeks gestation.  If these symptoms are not stopped, preterm labor can result in preterm delivery,  placing your baby at risk   Complete by: As directed       Allergies as of 03/18/2024   No Known Allergies      Medication List     TAKE these medications    albuterol  (2.5 MG/3ML) 0.083% nebulizer solution Commonly known as: PROVENTIL  Inhale 3 mLs (2.5 mg total) into the lungs every 4 (four) hours as needed for wheezing or shortness of breath.   aspirin  EC 81 MG tablet Take 2 tablets (162 mg total) by mouth daily. Swallow whole.   cetirizine 10 MG chewable tablet Commonly known as: ZYRTEC Chew 10 mg by mouth daily.   labetalol  100 MG tablet Commonly known as: NORMODYNE  Take 1 tablet (100 mg total) by mouth 2 (two) times daily.   multivitamin-prenatal 27-0.8 MG Tabs tablet Take 1 tablet by mouth daily at 12 noon.         Total time spent taking care of this patient: 80 minutes  Signed: Lou Rounds CNM, FNP 03/18/2024, 8:14 PM

## 2024-03-20 ENCOUNTER — Other Ambulatory Visit (HOSPITAL_COMMUNITY)
Admission: RE | Admit: 2024-03-20 | Discharge: 2024-03-20 | Disposition: A | Discharge status: Home / Self Care | Source: Ambulatory Visit | Attending: Certified Nurse Midwife | Admitting: Certified Nurse Midwife

## 2024-03-20 ENCOUNTER — Ambulatory Visit (INDEPENDENT_AMBULATORY_CARE_PROVIDER_SITE_OTHER): Admitting: Certified Nurse Midwife

## 2024-03-20 VITALS — BP 134/89 | HR 102 | Wt 258.0 lb

## 2024-03-20 DIAGNOSIS — Z113 Encounter for screening for infections with a predominantly sexual mode of transmission: Secondary | ICD-10-CM | POA: Diagnosis not present

## 2024-03-20 DIAGNOSIS — Z3A36 36 weeks gestation of pregnancy: Secondary | ICD-10-CM | POA: Insufficient documentation

## 2024-03-20 DIAGNOSIS — Z3685 Encounter for antenatal screening for Streptococcus B: Secondary | ICD-10-CM

## 2024-03-20 DIAGNOSIS — O0993 Supervision of high risk pregnancy, unspecified, third trimester: Secondary | ICD-10-CM

## 2024-03-20 DIAGNOSIS — O10919 Unspecified pre-existing hypertension complicating pregnancy, unspecified trimester: Secondary | ICD-10-CM

## 2024-03-20 LAB — POCT URINALYSIS DIPSTICK OB
Bilirubin, UA: NEGATIVE
Blood, UA: NEGATIVE
Glucose, UA: NEGATIVE
Ketones, UA: NEGATIVE
Leukocytes, UA: NEGATIVE
Nitrite, UA: NEGATIVE
Spec Grav, UA: 1.01 (ref 1.010–1.025)
Urobilinogen, UA: NEGATIVE U/dL — AB
pH, UA: 6 (ref 5.0–8.0)

## 2024-03-20 NOTE — Assessment & Plan Note (Addendum)
 GBS swab collected today Reviewed need for increased hydration that may help with small headaches and swelling.  Reviewed kick counts and preterm labor warning signs. Instructed to call office or come to hospital with persistent headache, vision changes, regular contractions, leaking of fluid, decreased fetal movement or vaginal bleeding.

## 2024-03-20 NOTE — Assessment & Plan Note (Addendum)
 Normotensive today without symptoms. Continues on 100mg  labetalol .  Took BP at [facility] yesterday and it was 140s/90s. MIL has BP cuff at home. Patient will take daily BP and message if she is 140/90 or above.  4/21 - BPP 8/8, cephalic, AFI WNL Next BPP 4/30

## 2024-03-20 NOTE — Progress Notes (Signed)
    Return Prenatal Note   Assessment/Plan   Plan  23 y.o. G1P0000 at [redacted]w[redacted]d presents for follow-up OB visit. Reviewed prenatal record including previous visit note.  Supervision of high-risk pregnancy GBS swab collected today Reviewed need for increased hydration that may help with small headaches and swelling.  Reviewed kick counts and preterm labor warning signs. Instructed to call office or come to hospital with persistent headache, vision changes, regular contractions, leaking of fluid, decreased fetal movement or vaginal bleeding.   Chronic pre-existing hypertension during pregnancy Normotensive today without symptoms. Continues on 100mg  labetalol .  Took BP at [facility] yesterday and it was 140s/90s. MIL has BP cuff at home. Patient will take daily BP and message if she is 140/90 or above.  4/21 - BPP 8/8, cephalic, AFI WNL Next BPP 4/30    Orders Placed This Encounter  Procedures   Strep Gp B NAA   POC Urinalysis Dipstick OB   No follow-ups on file.   Future Appointments  Date Time Provider Department Center  03/22/2024  2:30 PM [facility]  03/24/2024  9:55 AM Maniya Donovan, Sherline Distel, CNM [facility] None    For next visit:  continue with routine prenatal care     Subjective   22 y.o. G1P0000 at [redacted]w[redacted]d presents for this follow-up prenatal visit.  Patient has complaints of a small headache last night and this morning that resolved after ten minutes. She is having some allergy symptoms. Also has a good amount of swelling in her feet and hands.  Patient reports: Movement: Present Contractions: Not present  Objective   Flow sheet Vitals: Pulse Rate: (!) 102 BP: 134/89 Fundal Height: 37 cm Fetal Heart Rate (bpm): 150 Total weight gain: 33 lb (15 kg)  General Appearance  No acute distress, well appearing, and well nourished Pulmonary   Normal work of breathing Neurologic   Alert and oriented to person, place, and time Psychiatric   Mood and affect  within normal limits  Donato Fu, CNM  04/28/251:50 PM

## 2024-03-22 ENCOUNTER — Other Ambulatory Visit: Payer: Self-pay

## 2024-03-22 ENCOUNTER — Ambulatory Visit: Attending: Maternal & Fetal Medicine

## 2024-03-22 VITALS — BP 131/82 | HR 102

## 2024-03-22 DIAGNOSIS — O99213 Obesity complicating pregnancy, third trimester: Secondary | ICD-10-CM

## 2024-03-22 DIAGNOSIS — O10913 Unspecified pre-existing hypertension complicating pregnancy, third trimester: Secondary | ICD-10-CM | POA: Diagnosis present

## 2024-03-22 DIAGNOSIS — Z362 Encounter for other antenatal screening follow-up: Secondary | ICD-10-CM

## 2024-03-22 DIAGNOSIS — E669 Obesity, unspecified: Secondary | ICD-10-CM

## 2024-03-22 DIAGNOSIS — O10013 Pre-existing essential hypertension complicating pregnancy, third trimester: Secondary | ICD-10-CM

## 2024-03-22 DIAGNOSIS — O10919 Unspecified pre-existing hypertension complicating pregnancy, unspecified trimester: Secondary | ICD-10-CM

## 2024-03-22 DIAGNOSIS — Z3A36 36 weeks gestation of pregnancy: Secondary | ICD-10-CM | POA: Diagnosis not present

## 2024-03-22 DIAGNOSIS — O0993 Supervision of high risk pregnancy, unspecified, third trimester: Secondary | ICD-10-CM

## 2024-03-22 LAB — CERVICOVAGINAL ANCILLARY ONLY
Chlamydia: NEGATIVE
Comment: NEGATIVE
Comment: NORMAL
Neisseria Gonorrhea: NEGATIVE

## 2024-03-22 LAB — STREP GP B NAA: Strep Gp B NAA: NEGATIVE

## 2024-03-23 ENCOUNTER — Encounter: Admitting: Obstetrics and Gynecology

## 2024-03-24 ENCOUNTER — Encounter: Admitting: Certified Nurse Midwife

## 2024-03-29 ENCOUNTER — Other Ambulatory Visit

## 2024-03-29 ENCOUNTER — Ambulatory Visit: Admitting: Certified Nurse Midwife

## 2024-03-29 VITALS — BP 136/91 | HR 89 | Wt 262.7 lb

## 2024-03-29 DIAGNOSIS — O10913 Unspecified pre-existing hypertension complicating pregnancy, third trimester: Secondary | ICD-10-CM | POA: Diagnosis not present

## 2024-03-29 DIAGNOSIS — O10919 Unspecified pre-existing hypertension complicating pregnancy, unspecified trimester: Secondary | ICD-10-CM

## 2024-03-29 DIAGNOSIS — Z3A37 37 weeks gestation of pregnancy: Secondary | ICD-10-CM | POA: Diagnosis not present

## 2024-03-29 DIAGNOSIS — O99213 Obesity complicating pregnancy, third trimester: Secondary | ICD-10-CM

## 2024-03-29 DIAGNOSIS — O0993 Supervision of high risk pregnancy, unspecified, third trimester: Secondary | ICD-10-CM

## 2024-03-29 LAB — COMPREHENSIVE METABOLIC PANEL WITH GFR
ALT: 36 IU/L — ABNORMAL HIGH (ref 0–32)
AST: 26 IU/L (ref 0–40)
Albumin: 3.5 g/dL — ABNORMAL LOW (ref 4.0–5.0)
Alkaline Phosphatase: 209 IU/L — ABNORMAL HIGH (ref 44–121)
BUN/Creatinine Ratio: 16 (ref 9–23)
BUN: 9 mg/dL (ref 6–20)
Bilirubin Total: <0.2 mg/dL (ref 0.0–1.2)
CO2: 21 mmol/L (ref 20–29)
Calcium: 9.7 mg/dL (ref 8.7–10.2)
Chloride: 105 mmol/L (ref 96–106)
Creatinine, Ser: 0.55 mg/dL — ABNORMAL LOW (ref 0.57–1.00)
Globulin, Total: 2.3 g/dL (ref 1.5–4.5)
Glucose: 77 mg/dL (ref 70–99)
Potassium: 4.5 mmol/L (ref 3.5–5.2)
Sodium: 139 mmol/L (ref 134–144)
Total Protein: 5.8 g/dL — ABNORMAL LOW (ref 6.0–8.5)
eGFR: 134 mL/min/{1.73_m2} (ref >59)

## 2024-03-29 LAB — POCT URINALYSIS DIPSTICK OB
Appearance: NORMAL
Bilirubin, UA: NEGATIVE
Blood, UA: NEGATIVE
Glucose, UA: NEGATIVE
Ketones, UA: NEGATIVE
Leukocytes, UA: NEGATIVE
Nitrite, UA: NEGATIVE
Odor: NORMAL
Spec Grav, UA: 1.015 (ref 1.010–1.025)
Urobilinogen, UA: 0.2 U/dL
pH, UA: 5 (ref 5.0–8.0)

## 2024-03-29 LAB — CBC
Hematocrit: 38.2 % (ref 34.0–46.6)
Hemoglobin: 13 g/dL (ref 11.1–15.9)
MCH: 29.7 pg (ref 26.6–33.0)
MCHC: 34 g/dL (ref 31.5–35.7)
MCV: 87 fL (ref 79–97)
Platelets: 206 10*3/uL (ref 150–450)
RBC: 4.37 x10E6/uL (ref 3.77–5.28)
RDW: 13.4 % (ref 11.7–15.4)
WBC: 10.4 10*3/uL (ref 3.4–10.8)

## 2024-03-29 NOTE — Patient Instructions (Signed)

## 2024-03-30 ENCOUNTER — Inpatient Hospital Stay
Admission: AD | Admit: 2024-03-30 | Discharge: 2024-04-02 | DRG: 807 | Disposition: A | Discharge status: Home / Self Care | Source: Ambulatory Visit | Attending: Licensed Practical Nurse | Admitting: Licensed Practical Nurse

## 2024-03-30 ENCOUNTER — Encounter: Payer: Self-pay | Admitting: Obstetrics and Gynecology

## 2024-03-30 ENCOUNTER — Inpatient Hospital Stay: Admitting: Anesthesiology

## 2024-03-30 ENCOUNTER — Other Ambulatory Visit: Payer: Self-pay

## 2024-03-30 DIAGNOSIS — O113 Pre-existing hypertension with pre-eclampsia, third trimester: Secondary | ICD-10-CM | POA: Diagnosis not present

## 2024-03-30 DIAGNOSIS — O99214 Obesity complicating childbirth: Secondary | ICD-10-CM | POA: Diagnosis present

## 2024-03-30 DIAGNOSIS — Z3A37 37 weeks gestation of pregnancy: Secondary | ICD-10-CM | POA: Diagnosis not present

## 2024-03-30 DIAGNOSIS — O114 Pre-existing hypertension with pre-eclampsia, complicating childbirth: Principal | ICD-10-CM | POA: Diagnosis present

## 2024-03-30 DIAGNOSIS — O1092 Unspecified pre-existing hypertension complicating childbirth: Secondary | ICD-10-CM | POA: Diagnosis present

## 2024-03-30 DIAGNOSIS — O1002 Pre-existing essential hypertension complicating childbirth: Secondary | ICD-10-CM | POA: Diagnosis not present

## 2024-03-30 DIAGNOSIS — O0993 Supervision of high risk pregnancy, unspecified, third trimester: Principal | ICD-10-CM

## 2024-03-30 DIAGNOSIS — O9903 Anemia complicating the puerperium: Secondary | ICD-10-CM | POA: Diagnosis not present

## 2024-03-30 DIAGNOSIS — E66813 Obesity, class 3: Secondary | ICD-10-CM | POA: Diagnosis present

## 2024-03-30 DIAGNOSIS — Z8249 Family history of ischemic heart disease and other diseases of the circulatory system: Secondary | ICD-10-CM | POA: Diagnosis not present

## 2024-03-30 DIAGNOSIS — O10919 Unspecified pre-existing hypertension complicating pregnancy, unspecified trimester: Secondary | ICD-10-CM

## 2024-03-30 DIAGNOSIS — O1490 Unspecified pre-eclampsia, unspecified trimester: Secondary | ICD-10-CM | POA: Diagnosis present

## 2024-03-30 DIAGNOSIS — D62 Acute posthemorrhagic anemia: Secondary | ICD-10-CM | POA: Diagnosis not present

## 2024-03-30 LAB — COMPREHENSIVE METABOLIC PANEL WITH GFR
ALT: 35 U/L (ref 0–44)
AST: 30 U/L (ref 15–41)
Albumin: 2.9 g/dL — ABNORMAL LOW (ref 3.5–5.0)
Alkaline Phosphatase: 164 U/L — ABNORMAL HIGH (ref 38–126)
Anion gap: 9 (ref 5–15)
BUN: 7 mg/dL (ref 6–20)
CO2: 21 mmol/L — ABNORMAL LOW (ref 22–32)
Calcium: 9.1 mg/dL (ref 8.9–10.3)
Chloride: 107 mmol/L (ref 98–111)
Creatinine, Ser: 0.48 mg/dL (ref 0.44–1.00)
GFR, Estimated: >60 mL/min (ref >60)
Glucose, Bld: 80 mg/dL (ref 70–99)
Potassium: 3.7 mmol/L (ref 3.5–5.1)
Sodium: 137 mmol/L (ref 135–145)
Total Bilirubin: 0.4 mg/dL (ref 0.0–1.2)
Total Protein: 6.6 g/dL (ref 6.5–8.1)

## 2024-03-30 LAB — CBC
HCT: 38.6 % (ref 36.0–46.0)
Hemoglobin: 13 g/dL (ref 12.0–15.0)
MCH: 29.1 pg (ref 26.0–34.0)
MCHC: 33.7 g/dL (ref 30.0–36.0)
MCV: 86.5 fL (ref 80.0–100.0)
Platelets: 206 10*3/uL (ref 150–400)
RBC: 4.46 MIL/uL (ref 3.87–5.11)
RDW: 13.2 % (ref 11.5–15.5)
WBC: 11.4 10*3/uL — ABNORMAL HIGH (ref 4.0–10.5)
nRBC: 0 % (ref 0.0–0.2)

## 2024-03-30 LAB — TYPE AND SCREEN
ABO/RH(D): A POS
Antibody Screen: NEGATIVE

## 2024-03-30 LAB — ABO/RH: ABO/RH(D): A POS

## 2024-03-30 LAB — PROTEIN / CREATININE RATIO, URINE
Creatinine, Urine: 99.8 mg/dL
Protein, Ur: 65.8 mg/dL
Protein/Creat Ratio: 659 mg/g{creat} — ABNORMAL HIGH (ref 0–200)

## 2024-03-30 MED ORDER — MISOPROSTOL 50MCG HALF TABLET
50.0000 ug | ORAL_TABLET | ORAL | Status: DC | PRN
  Administered 2024-03-30: 50 ug via VAGINAL
  Filled 2024-03-30: qty 1

## 2024-03-30 MED ORDER — LIDOCAINE HCL (PF) 1 % IJ SOLN: 30.0000 mL | INTRAMUSCULAR | Status: DC | PRN

## 2024-03-30 MED ORDER — FENTANYL CITRATE (PF) 100 MCG/2ML IJ SOLN
50.0000 ug | INTRAMUSCULAR | Status: DC | PRN
  Administered 2024-03-30 (×2): 100 ug via INTRAVENOUS
  Filled 2024-03-30 (×2): qty 2

## 2024-03-30 MED ORDER — LACTATED RINGERS IV SOLN
INTRAVENOUS | Status: DC
Start: 2024-03-30 — End: 2024-03-31

## 2024-03-30 MED ORDER — LACTATED RINGERS IV SOLN: 500.0000 mL | Freq: Once | INTRAVENOUS | Status: DC

## 2024-03-30 MED ORDER — DIPHENHYDRAMINE HCL 50 MG/ML IJ SOLN: 12.5000 mg | INTRAMUSCULAR | Status: DC | PRN

## 2024-03-30 MED ORDER — MISOPROSTOL 200 MCG PO TABS
ORAL_TABLET | ORAL | Status: AC
  Filled 2024-03-30: qty 4

## 2024-03-30 MED ORDER — PHENYLEPHRINE 80 MCG/ML (10ML) SYRINGE FOR IV PUSH (FOR BLOOD PRESSURE SUPPORT): 80.0000 ug | PREFILLED_SYRINGE | INTRAVENOUS | Status: DC | PRN

## 2024-03-30 MED ORDER — TERBUTALINE SULFATE 1 MG/ML IJ SOLN: 0.2500 mg | Freq: Once | INTRAMUSCULAR | Status: DC | PRN

## 2024-03-30 MED ORDER — FENTANYL-BUPIVACAINE-NACL 0.5-0.125-0.9 MG/250ML-% EP SOLN
EPIDURAL | Status: AC
  Filled 2024-03-30: qty 250

## 2024-03-30 MED ORDER — ACETAMINOPHEN 325 MG PO TABS
650.0000 mg | ORAL_TABLET | Freq: Four times a day (QID) | ORAL | Status: DC | PRN
  Administered 2024-03-30: 650 mg via ORAL
  Filled 2024-03-30: qty 2

## 2024-03-30 MED ORDER — LIDOCAINE-EPINEPHRINE (PF) 1.5 %-1:200000 IJ SOLN
INTRAMUSCULAR | Status: DC | PRN
Start: 2024-03-30 — End: 2024-03-31
  Administered 2024-03-30: 3 mL via EPIDURAL

## 2024-03-30 MED ORDER — AMMONIA AROMATIC IN INHA
RESPIRATORY_TRACT | Status: AC
  Filled 2024-03-30: qty 10

## 2024-03-30 MED ORDER — LABETALOL HCL 100 MG PO TABS
100.0000 mg | ORAL_TABLET | Freq: Two times a day (BID) | ORAL | Status: DC
  Administered 2024-03-30 – 2024-04-01 (×5): 100 mg via ORAL
  Filled 2024-03-30 (×4): qty 1

## 2024-03-30 MED ORDER — EPHEDRINE 5 MG/ML INJ: 10.0000 mg | INTRAVENOUS | Status: DC | PRN

## 2024-03-30 MED ORDER — LIDOCAINE HCL (PF) 1 % IJ SOLN
INTRAMUSCULAR | Status: DC | PRN
  Administered 2024-03-30: 4 mL via SUBCUTANEOUS

## 2024-03-30 MED ORDER — MISOPROSTOL 25 MCG QUARTER TABLET
25.0000 ug | ORAL_TABLET | Freq: Once | ORAL | Status: AC
  Administered 2024-03-30: 25 ug via ORAL
  Filled 2024-03-30: qty 1

## 2024-03-30 MED ORDER — LIDOCAINE HCL (PF) 1 % IJ SOLN
INTRAMUSCULAR | Status: AC
  Filled 2024-03-30: qty 30

## 2024-03-30 MED ORDER — LABETALOL HCL 5 MG/ML IV SOLN: 80.0000 mg | INTRAVENOUS | Status: DC | PRN

## 2024-03-30 MED ORDER — OXYTOCIN-SODIUM CHLORIDE 30-0.9 UT/500ML-% IV SOLN
1.0000 m[IU]/min | INTRAVENOUS | Status: DC
  Administered 2024-03-30: 2 m[IU]/min via INTRAVENOUS
  Filled 2024-03-30 (×3): qty 500

## 2024-03-30 MED ORDER — LABETALOL HCL 100 MG PO TABS
ORAL_TABLET | ORAL | Status: AC
Start: 2024-03-30 — End: 2024-03-31
  Filled 2024-03-30: qty 1

## 2024-03-30 MED ORDER — OXYTOCIN-SODIUM CHLORIDE 30-0.9 UT/500ML-% IV SOLN
2.5000 [IU]/h | INTRAVENOUS | Status: DC
  Administered 2024-03-31: 2.5 [IU]/h via INTRAVENOUS
  Filled 2024-03-30: qty 500

## 2024-03-30 MED ORDER — SOD CITRATE-CITRIC ACID 500-334 MG/5ML PO SOLN: 30.0000 mL | ORAL | Status: DC | PRN

## 2024-03-30 MED ORDER — OXYTOCIN 10 UNIT/ML IJ SOLN
INTRAMUSCULAR | Status: AC
  Filled 2024-03-30: qty 2

## 2024-03-30 MED ORDER — LABETALOL HCL 5 MG/ML IV SOLN: 20.0000 mg | INTRAVENOUS | Status: DC | PRN

## 2024-03-30 MED ORDER — HYDRALAZINE HCL 20 MG/ML IJ SOLN: 10.0000 mg | INTRAMUSCULAR | Status: DC | PRN

## 2024-03-30 MED ORDER — MISOPROSTOL 50MCG HALF TABLET
50.0000 ug | ORAL_TABLET | Freq: Once | ORAL | Status: AC
  Administered 2024-03-30: 50 ug via VAGINAL
  Filled 2024-03-30: qty 1

## 2024-03-30 MED ORDER — SODIUM CHLORIDE 0.9 % IV SOLN
INTRAVENOUS | Status: DC | PRN
  Administered 2024-03-30 (×2): 5 mL via EPIDURAL

## 2024-03-30 MED ORDER — FENTANYL-BUPIVACAINE-NACL 0.5-0.125-0.9 MG/250ML-% EP SOLN
12.0000 mL/h | EPIDURAL | Status: DC | PRN
  Administered 2024-03-30: 12 mL/h via EPIDURAL

## 2024-03-30 MED ORDER — LACTATED RINGERS IV SOLN
500.0000 mL | INTRAVENOUS | Status: DC | PRN
  Administered 2024-03-31: 500 mL via INTRAVENOUS

## 2024-03-30 MED ORDER — ONDANSETRON HCL 4 MG/2ML IJ SOLN
4.0000 mg | Freq: Four times a day (QID) | INTRAMUSCULAR | Status: DC | PRN
  Administered 2024-03-31: 4 mg via INTRAVENOUS
  Filled 2024-03-30: qty 2

## 2024-03-30 MED ORDER — OXYTOCIN BOLUS FROM INFUSION
333.0000 mL | Freq: Once | INTRAVENOUS | Status: AC
  Administered 2024-03-31: 333 mL via INTRAVENOUS

## 2024-03-30 MED ORDER — LABETALOL HCL 5 MG/ML IV SOLN: 40.0000 mg | INTRAVENOUS | Status: DC | PRN

## 2024-03-30 NOTE — Anesthesia Preprocedure Evaluation (Signed)
 Anesthesia Evaluation  Patient identified by MRN, date of birth, ID band Patient awake    Reviewed: Allergy & Precautions, H&P , NPO status , Patient's Chart, lab work & pertinent test results, reviewed documented beta blocker date and time   History of Anesthesia Complications Negative for: history of anesthetic complications  Airway Mallampati: II  TM Distance: >3 FB Neck ROM: full    Dental no notable dental hx.    Pulmonary shortness of breath (secondary to allergies, improved with albuterol  and zyrtec), asthma , neg COPD, neg recent URI   Pulmonary exam normal breath sounds clear to auscultation       Cardiovascular Exercise Tolerance: Good hypertension (Pre-eclampsia), (-) angina Normal cardiovascular exam(-) dysrhythmias (-) Valvular Problems/Murmurs Rhythm:regular Rate:Normal     Neuro/Psych negative neurological ROS  negative psych ROS   GI/Hepatic negative GI ROS, Neg liver ROS,,,  Endo/Other  neg diabetes  Class 3 obesity  Renal/GU negative Renal ROS  negative genitourinary   Musculoskeletal   Abdominal   Peds  Hematology negative hematology ROS (+)   Anesthesia Other Findings Past Medical History: No date: Asthma     Comment:  as a child No date: Hypertension   Reproductive/Obstetrics (+) Pregnancy                             Anesthesia Physical Anesthesia Plan  ASA: 3  Anesthesia Plan: Epidural   Post-op Pain Management:    Induction:   PONV Risk Score and Plan:   Airway Management Planned:   Additional Equipment:   Intra-op Plan:   Post-operative Plan:   Informed Consent: I have reviewed the patients History and Physical, chart, labs and discussed the procedure including the risks, benefits and alternatives for the proposed anesthesia with the patient or authorized representative who has indicated his/her understanding and acceptance.     Dental Advisory  Given  Plan Discussed with: Anesthesiologist, CRNA and Surgeon  Anesthesia Plan Comments:        Anesthesia Quick Evaluation

## 2024-03-30 NOTE — Anesthesia Procedure Notes (Signed)
 Epidural Patient location during procedure: OB Start time: 03/30/2024 9:16 PM End time: 03/30/2024 9:22 PM  Staffing Anesthesiologist: [name], MD Performed: anesthesiologist   Preanesthetic Checklist Completed: patient identified, IV checked, site marked, risks and benefits discussed, surgical consent, monitors and equipment checked, pre-op evaluation and timeout performed  Epidural Patient position: sitting Prep: ChloraPrep Patient monitoring: heart rate, continuous pulse ox and blood pressure Approach: midline Location: L3-L4 Injection technique: LOR saline  Needle:  Needle type: Tuohy  Needle gauge: 17 G Needle length: 9 cm Needle insertion depth: 7.5 cm Catheter type: closed end flexible Catheter size: 19 Gauge Catheter at skin depth: 12.5 cm Test dose: negative and 1.5% lidocaine  with Epi 1:200 K  Assessment Sensory level: T10 Events: blood not aspirated, no cerebrospinal fluid, injection not painful, no injection resistance, no paresthesia and negative IV test  Additional Notes 1st attempt Pt. Evaluated and documentation done after procedure finished. Patient identified. Risks/Benefits/Options discussed with patient including but not limited to bleeding, infection, nerve damage, paralysis, failed block, incomplete pain control, headache, blood pressure changes, nausea, vomiting, reactions to medication both or allergic, itching and postpartum back pain. Confirmed with bedside nurse the patient's most recent platelet count. Confirmed with patient that they are not currently taking any anticoagulation, have any bleeding history or any family history of bleeding disorders. Patient expressed understanding and wished to proceed. All questions were answered. Sterile technique was used throughout the entire procedure. Please see nursing notes for vital signs. Test dose was given through epidural catheter and negative prior to continuing to dose epidural or start infusion.  Warning signs of high block given to the patient including shortness of breath, tingling/numbness in hands, complete motor block, or any concerning symptoms with instructions to call for help. Patient was given instructions on fall risk and not to get out of bed. All questions and concerns addressed with instructions to call with any issues or inadequate analgesia.    Patient tolerated the insertion well without immediate complications.Reason for block:procedure for pain

## 2024-03-30 NOTE — H&P (Addendum)
 Kathleen Wong is a 23 y.o. female presenting for IOL secondary to [facility] with superimposed preeclampsia. Kathleen Wong was diagnosed with CHTN in early pregnancy and was started on Labetalol . She was seen on 5/7 for a ROB, her UPC was 659, and her ALT was 36. This morning she was called and instructed to go to L and D for IOL. Kathleen Wong currently has a dull HA on  her left side. She states she has had frequent headaches over the last week, but they always go away on their own. Denies Visual disturbances or RUQ pain. She has a hx of Asthma, last used her inhaler last week, she has never been hospitalized for asthma. On 4/30 an US  showed EFW 2802 grams, 39%.    OB History     Gravida  1   Para  0   Term  0   Preterm  0   AB  0   Living  0      SAB  0   IAB  0   Ectopic  0   Multiple  0   Live Births  0          Past Medical History:  Diagnosis Date   Asthma    as a child   Hypertension    Past Surgical History:  Procedure Laterality Date   NO PAST SURGERIES     Family History: family history includes Autism in her maternal uncle; Healthy in her brother, father, maternal grandfather, maternal grandmother, mother, paternal grandfather, sister, and sister; Heart attack in her paternal grandmother. Social History:  reports that she has never smoked. She has never used smokeless tobacco. She reports that she does not currently use alcohol. She reports that she does not use drugs.     Maternal Diabetes: No Genetic Screening: Normal Maternal Ultrasounds/Referrals: Normal Fetal Ultrasounds or other Referrals:  Referred to Materal Fetal Medicine  Maternal Substance Abuse:  No Significant Maternal Medications:  None Significant Maternal Lab Results:  Group B Strep negative Number of Prenatal Visits:greater than 3 verified prenatal visits Maternal Vaccinations:NA Other Comments:  family hx of SIDS, 5 children have passed from SIDS   Review of Systems see HPI  History    Blood pressure (!) 145/89, pulse 98, temperature 98.9 F (37.2 C), temperature source Oral, resp. rate 16, height 5\' 5"  (1.651 m), weight 119.2 kg, last menstrual period 06/24/2023. Maternal Exam:  Introitus: Normal vulva.   Physical Exam Constitutional:      Appearance: Normal appearance.  Cardiovascular:     Rate and Rhythm: Normal rate and regular rhythm.     Pulses: Normal pulses.     Heart sounds: Normal heart sounds.  Pulmonary:     Effort: Pulmonary effort is normal.  Abdominal:     Tenderness: There is no abdominal tenderness.     Comments: Gravid, EFW 6.5lbs VE 0.5/thick/high  BSUS confirms vertex  Genitourinary:    General: Normal vulva.  Musculoskeletal:        General: Normal range of motion.     Cervical back: Normal range of motion.     Right lower leg: Edema present.     Left lower leg: Edema present.  Skin:    General: Skin is warm.  Neurological:     General: No focal deficit present.     Mental Status: She is alert.  Psychiatric:        Mood and Affect: Mood normal.     EFM: baseline 135, moderate variability, pos accel,  neg decel TOCO; rare contraction   Prenatal labs: ABO, Rh: --/--/PENDING (05/08 1151) Antibody: PENDING (05/08 1151) Rubella: 1.14 (11/15 1041) RPR: Non Reactive (03/06 1105)  HBsAg: Negative (11/15 1041)  HIV: Non Reactive (03/06 1105)  GBS: Negative/-- (04/28 1348)   Assessment/Plan: G1P) at 37wk4day by 6wkUS admitted for IOL secondary to [facility] with superimposed preeclampsia   -Tylenol  ordered -Cytotec placed, will place ripening balloon at next exam -Category I tracing -Preeclampsia labs pending,  -Pain management: planning epidural.   Dr Mariel Shope aware of admission and plan   Kathleen Wong Kathleen Wong 03/30/2024, 12:35 PM

## 2024-03-30 NOTE — Progress Notes (Signed)
    Return Prenatal Note   Subjective   22 y.o. G1P0000 at [redacted]w[redacted]d presents for this follow-up prenatal visit.  Patient denies headache, visual changes, RUQ pain. Endorses fetal movement. Patient reports: Movement: Present Contractions: Irritability  Objective   Flow sheet Vitals: Pulse Rate: 89 BP: (!) 136/91 Total weight gain: 37 lb 11.2 oz (17.1 kg)  Fetus A Non-Stress Test Interpretation for 03/29/24  Indication: Chronic Hypertenstion  Fetal Heart Rate A Mode: External Baseline Rate (A): 135 bpm Variability: Moderate Accelerations: 15 x 15 Decelerations: None Scalp Stimulation: Negative Multiple birth?: No  Uterine Activity Mode: Toco Contraction Frequency (min): 8 Contraction Duration (sec): 40-60 Contraction Quality: Mild Resting Time: Adequate  Interpretation (Fetal Testing) Nonstress Test Interpretation: Reactive Overall Impression: Reassuring for gestational age   General Appearance  No acute distress, well appearing, and well nourished Pulmonary   Normal work of breathing Neurologic   Alert and oriented to person, place, and time Psychiatric   Mood and affect within normal limits  Assessment/Plan   Plan  23 y.o. G1P0000 at [redacted]w[redacted]d presents for follow-up OB visit. Reviewed prenatal record including previous visit note.  Chronic pre-existing hypertension during pregnancy Stat labs today, reviewed signs/symptoms of pre-eclampsia and when to present to hospital.  Supervision of high-risk pregnancy Reviewed kick counts and preterm labor warning signs. Instructed to call office or come to hospital with persistent headache, vision changes, regular contractions, leaking of fluid, decreased fetal movement or vaginal bleeding. Blood pressure mild range today despite taking labetalol  as prescribed, reviewed concerns for development of pre-eclampsia, discussed visit to L&D triage for serial BP & labs vs stat labs through [facility], reviewed that if pre-eclampsia  diagnosed would recommend earlier deliver. Desires stat labs through [facility], will contact with results.      Orders Placed This Encounter  Procedures   Comprehensive metabolic panel with GFR   CBC   Protein / creatinine ratio, urine   POC Urinalysis Dipstick OB   Return in 1 week (on 04/05/2024) for ROB & NST.   Future Appointments  Date Time Provider Department Center  04/04/2024  2:15 PM AOB-NST ROOM [facility] None  04/04/2024  2:35 PM Aisha Ali, Barbra Boone, CNM [facility] None    For next visit:  ROB with NST     Forestine Igo, CNM

## 2024-03-30 NOTE — Assessment & Plan Note (Signed)
 Stat labs today, reviewed signs/symptoms of pre-eclampsia and when to present to hospital.

## 2024-03-30 NOTE — Progress Notes (Signed)
 Kathleen Wong is a 23 y.o. G1P0000 at [redacted]w[redacted]d by ultrasound admitted for induction of labor due to Hypertension.  Subjective: Feeling some cramping, offered ripening balloon, pt accepted, mother and partner at her side.   Objective: BP (!) 149/87   Pulse 89   Temp 98.9 F (37.2 C) (Oral)   Resp 16   Ht 5\' 5"  (1.651 m)   Wt 119.2 kg   LMP 06/24/2023 (Within Days)   BMI 43.72 kg/m  No intake/output data recorded. No intake/output data recorded.  FHT:  FHR: 135 bpm, variability: moderate,  accelerations:  Present,  decelerations:  Absent UC:   irregular  SVE:   Dilation: 1 Effacement (%): 50 Station: -3 Exam by:: L Lisett Dirusso, CNM  Labs: Lab Results  Component Value Date   WBC 11.4 (H) 03/30/2024   HGB 13.0 03/30/2024   HCT 38.6 03/30/2024   MCV 86.5 03/30/2024   PLT 206 03/30/2024    Assessment / Plan: IOL secondary to [facility] with superimposed preeclampsia, ripening stage   Labor: Cook's catheter easily inserted, Cytotec #2 placed, reevaluate in 4 hours.   Preeclampsia:  labs stable BP mildly elevated  Fetal Wellbeing:  Category I Pain Control:  planning epidural  I/D:  GBS negative, membrane intact  Anticipated MOD:  NSVD  [name], CNM 03/30/2024, 4:54 PM

## 2024-03-30 NOTE — Assessment & Plan Note (Signed)
 Reviewed kick counts and preterm labor warning signs. Instructed to call office or come to hospital with persistent headache, vision changes, regular contractions, leaking of fluid, decreased fetal movement or vaginal bleeding. Blood pressure mild range today despite taking labetalol  as prescribed, reviewed concerns for development of pre-eclampsia, discussed visit to L&D triage for serial BP & labs vs stat labs through [facility], reviewed that if pre-eclampsia diagnosed would recommend earlier deliver. Desires stat labs through [facility], will contact with results.

## 2024-03-30 NOTE — Progress Notes (Signed)
 Kathleen Wong is a 23 y.o. G1P0000 at [redacted]w[redacted]d by ultrasound admitted for induction of labor due to Hypertension.  Subjective: Now feeling more cramping, received IV medication, states it made her sleepy but ddi not help with pain.   Objective: BP (!) 147/90   Pulse 82   Temp 98.8 F (37.1 C) (Oral)   Resp 14   Ht 5\' 5"  (1.651 m)   Wt 119.2 kg   LMP 06/24/2023 (Within Days)   BMI 43.72 kg/m  No intake/output data recorded. No intake/output data recorded.  FHT:  FHR: 125 bpm, variability: moderate,  accelerations:  Present,  decelerations:  Absent UC:   irregular  SVE:   Dilation: 3 Effacement (%): 50 Station: -3 Exam by:: Kathleen Wong, CNM  Labs: Lab Results  Component Value Date   WBC 11.4 (H) 03/30/2024   HGB 13.0 03/30/2024   HCT 38.6 03/30/2024   MCV 86.5 03/30/2024   PLT 206 03/30/2024    Assessment / Plan: IOL secondary to [facility] with superimposed preeclampsia, ripening stage   Labor: cook's loose but still present, VE 3cm.  Preeclampsia:  labs stable Fetal Wellbeing:  Category I Pain Control:  IV pain meds and desires epidural  I/D:  GBS negative, membrane intact  Anticipated MOD:  NSVD  Kathleen Wong, CNM 03/30/2024, 8:04 PM

## 2024-03-30 NOTE — Progress Notes (Signed)
 Patient ID: Kathleen Wong, female   DOB: 04-20-2001, 23 y.o.   MRN: [id number] Chart and fetal stripe reviewed . Updated with CNM Dominic. Proceed

## 2024-03-31 ENCOUNTER — Encounter: Payer: Self-pay | Admitting: Obstetrics and Gynecology

## 2024-03-31 DIAGNOSIS — O114 Pre-existing hypertension with pre-eclampsia, complicating childbirth: Principal | ICD-10-CM

## 2024-03-31 DIAGNOSIS — O9903 Anemia complicating the puerperium: Secondary | ICD-10-CM

## 2024-03-31 DIAGNOSIS — O1002 Pre-existing essential hypertension complicating childbirth: Secondary | ICD-10-CM

## 2024-03-31 DIAGNOSIS — D62 Acute posthemorrhagic anemia: Secondary | ICD-10-CM

## 2024-03-31 LAB — RPR: RPR Ser Ql: NONREACTIVE

## 2024-03-31 MED ORDER — ONDANSETRON HCL 4 MG PO TABS: 4.0000 mg | ORAL_TABLET | ORAL | Status: DC | PRN

## 2024-03-31 MED ORDER — BENZOCAINE-MENTHOL 20-0.5 % EX AERO
1.0000 | INHALATION_SPRAY | CUTANEOUS | Status: DC | PRN
  Administered 2024-03-31: 1 via TOPICAL
  Filled 2024-03-31: qty 56

## 2024-03-31 MED ORDER — SIMETHICONE 80 MG PO CHEW: 80.0000 mg | CHEWABLE_TABLET | ORAL | Status: DC | PRN

## 2024-03-31 MED ORDER — SENNOSIDES-DOCUSATE SODIUM 8.6-50 MG PO TABS
2.0000 | ORAL_TABLET | Freq: Every day | ORAL | Status: DC
  Administered 2024-04-01: 2 via ORAL
  Filled 2024-03-31: qty 2

## 2024-03-31 MED ORDER — TETANUS-DIPHTH-ACELL PERTUSSIS 5-2.5-18.5 LF-MCG/0.5 IM SUSY: 0.5000 mL | PREFILLED_SYRINGE | Freq: Once | INTRAMUSCULAR | Status: DC

## 2024-03-31 MED ORDER — DIBUCAINE (PERIANAL) 1 % EX OINT
1.0000 | TOPICAL_OINTMENT | CUTANEOUS | Status: DC | PRN
  Administered 2024-03-31: 1 via RECTAL
  Filled 2024-03-31: qty 28

## 2024-03-31 MED ORDER — ACETAMINOPHEN 500 MG PO TABS
1000.0000 mg | ORAL_TABLET | Freq: Four times a day (QID) | ORAL | Status: DC | PRN
  Administered 2024-03-31: 1000 mg via ORAL
  Filled 2024-03-31: qty 2

## 2024-03-31 MED ORDER — ACETAMINOPHEN 325 MG PO TABS
650.0000 mg | ORAL_TABLET | ORAL | Status: DC | PRN
  Administered 2024-03-31 – 2024-04-01 (×2): 650 mg via ORAL
  Filled 2024-03-31 (×2): qty 2

## 2024-03-31 MED ORDER — ZOLPIDEM TARTRATE 5 MG PO TABS: 5.0000 mg | ORAL_TABLET | Freq: Every evening | ORAL | Status: DC | PRN

## 2024-03-31 MED ORDER — PRENATAL MULTIVITAMIN CH
1.0000 | ORAL_TABLET | Freq: Every day | ORAL | Status: DC
  Administered 2024-04-01 – 2024-04-02 (×2): 1 via ORAL
  Filled 2024-03-31 (×2): qty 1

## 2024-03-31 MED ORDER — ONDANSETRON HCL 4 MG/2ML IJ SOLN: 4.0000 mg | INTRAMUSCULAR | Status: DC | PRN

## 2024-03-31 MED ORDER — COCONUT OIL OIL: 1.0000 | TOPICAL_OIL | Status: DC | PRN

## 2024-03-31 MED ORDER — WITCH HAZEL-GLYCERIN EX PADS
1.0000 | MEDICATED_PAD | CUTANEOUS | Status: DC | PRN
  Administered 2024-03-31: 1 via TOPICAL
  Filled 2024-03-31 (×2): qty 100

## 2024-03-31 MED ORDER — IBUPROFEN 600 MG PO TABS
600.0000 mg | ORAL_TABLET | Freq: Four times a day (QID) | ORAL | Status: DC
  Administered 2024-03-31 – 2024-04-02 (×7): 600 mg via ORAL
  Filled 2024-03-31 (×7): qty 1

## 2024-03-31 MED ORDER — DIPHENHYDRAMINE HCL 25 MG PO CAPS: 25.0000 mg | ORAL_CAPSULE | Freq: Four times a day (QID) | ORAL | Status: DC | PRN

## 2024-03-31 NOTE — Lactation Note (Signed)
 This note was copied from a baby's chart. Lactation Consultation Note  Patient Name: Kathleen Wong ZOXWR'[id number] Date: 03/31/2024 Age:22 hours Reason for consult: L&D Initial assessment;Primapara;1st time breastfeeding;Early term 37-38.6wks   Maternal Data Has patient been taught Hand Expression?: Yes Does the patient have breastfeeding experience prior to this delivery?: No  Feeding Mother's Current Feeding Choice: Breast Milk Mom shown how to hand express a few drops from right breast, baby able to latch in football hold after few attempts, nursed well with occ swallow noted, attempted on left breast LATCH Score Latch: Grasps breast easily, tongue down, lips flanged, rhythmical sucking.  Audible Swallowing: A few with stimulation  Type of Nipple: Everted at rest and after stimulation  Comfort (Breast/Nipple): Soft / non-tender  Hold (Positioning): Assistance needed to correctly position infant at breast and maintain latch.  LATCH Score: 8   Lactation Tools Discussed/Used    Interventions Interventions: Breast feeding basics reviewed;Assisted with latch;Skin to skin;Hand express;Adjust position;Support pillows;Education  Discharge Pump: Personal WIC Program: Yes  Consult Status Consult Status: Follow-up from L&D Date: 03/31/24 Follow-up type: In-patient    Leoma Raja 03/31/2024, 2:08 PM

## 2024-03-31 NOTE — Significant Event (Signed)
 Immediately responded to overhead page of "Code [facility] Neonate LDR 2" On arrival LDR staff, MD, other Medical staff present at neonate warmer. On arrival was told situation was resolved and heard baby with a strong cry. Mother was in bed, crying and expressing concern for baby. [facility] staff member and family were bedside. [facility] RN explaining what was happening and reassuring pt.

## 2024-03-31 NOTE — Discharge Summary (Signed)
     OB Discharge Summary     Patient Name: Kathleen Wong DOB: [birth year] MRN: [id number]  Date of admission: 03/30/2024 Delivering MD: Lou Rounds, CNM  Date of Delivery: 03/31/2024  Date of discharge: 03/31/2024  Admitting diagnosis: Preeclampsia [O14.90] Intrauterine pregnancy: [redacted]w[redacted]d     Secondary diagnosis: Chronic Hypertension with Superimposed Preeclampsia     Discharge diagnosis: Term Pregnancy Delivered and CHTN with superimposed preeclampsia                                                                                                Post partum procedures:{Postpartum procedures:23558}  Augmentation: AROM, Pitocin , Cytotec , and IP Foley  Complications: None  Hospital course:  Induction of Labor With Vaginal Delivery   23 y.o. yo G1P0000 at [redacted]w[redacted]d was admitted to the hospital 03/30/2024 for induction of labor.  Indication for induction: CHTN with superimposed Preeclampsia.  Patient had an labor course complicated bynone Membrane Rupture Time/Date: 7:52 AM,03/31/2024  Delivery Method:Vaginal, Spontaneous Operative Delivery:N/A Episiotomy: None Lacerations:   intact Details of delivery can be found in separate delivery note.  Patient had a postpartum course complicated by***. Patient is discharged home 03/31/24.  Newborn Data: Birth date:03/31/2024 Birth time:11:58 AM Gender:Female Living status:Living Apgars:2 ,4 , 9 Weight:3160 g  Subjective:   Physical exam  Vitals:   03/31/24 1208 03/31/24 1209 03/31/24 1214 03/31/24 1223  BP: (!) 155/77   (!) 154/118  Pulse: (!) 104   (!) 102  Resp:      Temp:      TempSrc:      SpO2:  100% 98%   Weight:      Height:       General: {Exam; general:21111117} Breast: soft, non-tender, nipples without breakdown Lochia: {Desc; appropriate/inappropriate:30686::"appropriate"} Uterine Fundus: {Desc; firm/soft:30687} Perineum: no erythema or foul odor discharge, minimal edema DVT Evaluation: {Exam; dvt:2111122}  Labs: Lab  Results  Component Value Date   WBC 11.4 (H) 03/30/2024   HGB 13.0 03/30/2024   HCT 38.6 03/30/2024   MCV 86.5 03/30/2024   PLT 206 03/30/2024    Discharge instruction: in After Visit Summary.  Medications:  Allergies as of 03/31/2024   No Known Allergies   Med Rec must be completed prior to using this SMARTLINK***        Activity: Advance as tolerated. Pelvic rest for 6 weeks.   Outpatient follow up:     Postpartum contraception: Nexplanon Rhogam Given postpartum: not indicated Rubella vaccine given postpartum: not indicated Varicella vaccine given postpartum: not indicated TDaP given antepartum or postpartum: ***  Newborn Data: Live born female  Birth Weight: 6 lb 15.5 oz (3160 g) APGAR: 2, 4, 9  Newborn Delivery   Birth date/time: 03/31/2024 11:58:00 Delivery type: Vaginal, Spontaneous      Baby Feeding: {Baby feeding:23562}  Disposition:{CHL IP OB HOME WITH WUJWJX:91478}   Lou Rounds, CNM, FNP 03/31/2024 12:33 PM

## 2024-03-31 NOTE — Progress Notes (Signed)
   03/31/24 1200  Spiritual Encounters  Type of Visit Initial  Care provided to: Pt and family (Newborn arrived!)  Psychologist, sport and exercise partners present during encounter Other (comment) Chiropractor)  Referral source Code page  Reason for visit Code  OnCall Visit No  Spiritual Framework  Presenting Themes Meaning/purpose/sources of inspiration;Goals in life/care;Coping tools;Impactful experiences and emotions  Interventions  Spiritual Care Interventions Made Established relationship of care and support;Compassionate presence;Normalization of emotions;Other (comment) (Chaplain prayed silently on the way and in the room for the family; let Pt know and the family was glad and grateful for everyone!)  Intervention Outcomes  Outcomes Connection to spiritual care;Awareness around self/spiritual resourses;Awareness of support

## 2024-03-31 NOTE — Progress Notes (Signed)
   03/31/24 1715  Spiritual Encounters  Type of Visit Initial  Care provided to: Pt and family  Referral source Family  Reason for visit Routine spiritual support  OnCall Visit No  Spiritual Framework  Presenting Themes Significant life change  Interventions  Spiritual Care Interventions Made Established relationship of care and support;Reflective listening;Prayer  Intervention Outcomes  Outcomes Connection to spiritual care  Spiritual Care Plan  Spiritual Care Issues Still Outstanding No further spiritual care needs at this time (see row info)   Chaplain visited with patient to see how the new mom, dad, and baby were doing. Chaplain encouraged patient to let nurses know if her headache came back due to blood pressure.

## 2024-03-31 NOTE — Lactation Note (Addendum)
 This note was copied from a baby's chart. Lactation Consultation Note  Patient Name: Kathleen Wong ZOXWR'U Date: 03/31/2024 Age:22 hours Reason for consult: Follow-up assessment;Primapara;Early term 37-38.6wks;Breastfeeding assistance   Maternal Data    Feeding Mother's Current Feeding Choice: Breast Milk BAby received glucose gel re: blood sugar 39, assisted mom with latching baby to breast, mom turned to left side as breast sl firm and softens with position change, baby positioned beside mom , mom shown hand expression of few drops colostrum, atched well  after few attempts, occ swallow heard, nursed 30 min, repeat glucose was 53 per A Williams RN, baby then place in side lying position at mom's right breast with mom in side lying position, easily latches as this breast softer and nursed x 30 min with more swallows noted   LATCH Score Latch: Grasps breast easily, tongue down, lips flanged, rhythmical sucking.  Audible Swallowing: A few with stimulation  Type of Nipple: Everted at rest and after stimulation (full around areola)  Comfort (Breast/Nipple): Soft / non-tender  Hold (Positioning): Assistance needed to correctly position infant at breast and maintain latch.  LATCH Score: 8   Lactation Tools Discussed/Used   LC name written on white board  Interventions Interventions: Assisted with latch;Skin to skin;Hand express;Adjust position;Support pillows;Education  Discharge    Consult Status Consult Status: Follow-up Date: 04/01/24 Follow-up type: In-patient    Kathleen Wong 03/31/2024, 5:00 PM

## 2024-03-31 NOTE — Progress Notes (Signed)
   03/31/24 1215  Spiritual Encounters  Type of Visit Initial  Care provided to: Family  Referral source Code page  Reason for visit Code  OnCall Visit Yes  Spiritual Framework  Presenting Themes Meaning/purpose/sources of inspiration;Significant life change (Grandparent for the first time)  Interventions  Spiritual Care Interventions Made Established relationship of care and support;Compassionate presence;Reflective listening  Intervention Outcomes  Outcomes Connection to spiritual care  Spiritual Care Plan  Spiritual Care Issues Still Outstanding No further spiritual care needs at this time (see row info)   Chaplain Jenette Mitchell was getting familiar with L/D floor and speaking with Chaplain Alta Ast who had responded to the code page, when Oma, grandmother, came out and became overwhelmed with emotion at her first grandchild by her first born. Walked with Oma to meet family.

## 2024-03-31 NOTE — Progress Notes (Signed)
 Kathleen Wong is a 23 y.o. G1P0000 at [redacted]w[redacted]d by ultrasound admitted for induction of labor due to Hypertension.  Subjective: Resting quietly with epidural. Family at the bedside.   Objective: BP (!) 140/81   Pulse 81   Temp 98.8 F (37.1 C) (Oral)   Resp 14   Ht 5\' 5"  (1.651 m)   Wt 119.2 kg   LMP 06/24/2023 (Within Days)   SpO2 98%   BMI 43.72 kg/m  No intake/output data recorded. Total I/O In: -  Out: 900 [Urine:900]  FHT:  FHR: 135 bpm, variability: moderate,  accelerations:  Present,  decelerations:  Absent UC:   regular, every 1.5-2.5 minutes SVE:   Dilation: 5.5 (Simultaneous filing. User may not have seen previous data.) Effacement (%): 70 Station: -3 Exam by:: Kathleen Wong, CNM Pitocin  at 10 milli-units   Labs: Lab Results  Component Value Date   WBC 11.4 (H) 03/30/2024   HGB 13.0 03/30/2024   HCT 38.6 03/30/2024   MCV 86.5 03/30/2024   PLT 206 03/30/2024    Assessment / Plan:  IOL secondary to [facility] with superimposed preeclampsia, early labor Labor: Progressing on Pitocin , will continue to increase then AROM when appropriate  Preeclampsia:  BP stable  Fetal Wellbeing:  Category I Pain Control:  Epidural I/D:  GBS negative, membranes intact  Anticipated MOD:  NSVD  Kathleen Wong, CNM 03/31/2024, 1:29 AM

## 2024-03-31 NOTE — Progress Notes (Signed)
 Kathleen Wong is a 23 y.o. G1P0000 at [redacted]w[redacted]d by ultrasound admitted for induction of labor due to Hypertension.  Subjective: Resting comfortably with epidural, is having some low back pain.   Objective: BP (!) 142/88 (BP Location: Right Arm)   Pulse 77   Temp 98.8 F (37.1 C) (Oral)   Resp 16   Ht 5\' 5"  (1.651 m)   Wt 119.2 kg   LMP 06/24/2023 (Within Days)   SpO2 98%   BMI 43.72 kg/m  I/O last 3 completed shifts: In: -  Out: 1250 [Urine:1250] Total I/O In: -  Out: 350 [Urine:350]  FHT: baseline 130, min to moderate variability, pos accel, lates present  UC:   regular, every 2-2.5 minutes SVE:   Dilation: 7.5 Effacement (%): 80 Station: -2 Exam by:: Krishna Heuer CNM Pitocin  at 8 milli-units   Labs: Lab Results  Component Value Date   WBC 11.4 (H) 03/30/2024   HGB 13.0 03/30/2024   HCT 38.6 03/30/2024   MCV 86.5 03/30/2024   PLT 206 03/30/2024    Assessment / Plan: Induction of labor due to preeclampsia,  progressing well on pitocin   Labor: Progressing normally Dr Luster Salters called at 0745 reviewed tracing-RN has decreased pitocin , started IV bolus and repositioned pt, Dr Luster Salters rec checking cervix and placing IUPC if able. VE 7.5/90/-2 AROM'd for clear with FSE, IUPC placed. Discussed concern for tracing with pt, advised sometimes a c-section is necessary due to the tracing.  Preeclampsia:  BP mild range, no severe symptoms Fetal Wellbeing:  Category I and Category II improving with intervention  Pain Control:  Epidural I/D:  GBS negative, AROM for clear at 0752 Anticipated MOD:  NSVD  [name] [facility], CNM 03/31/2024, 8:35 AM

## 2024-04-01 LAB — CBC
HCT: 35.3 % — ABNORMAL LOW (ref 36.0–46.0)
Hemoglobin: 11.9 g/dL — ABNORMAL LOW (ref 12.0–15.0)
MCH: 30.2 pg (ref 26.0–34.0)
MCHC: 33.7 g/dL (ref 30.0–36.0)
MCV: 89.6 fL (ref 80.0–100.0)
Platelets: 197 10*3/uL (ref 150–400)
RBC: 3.94 MIL/uL (ref 3.87–5.11)
RDW: 13.8 % (ref 11.5–15.5)
WBC: 17.7 10*3/uL — ABNORMAL HIGH (ref 4.0–10.5)
nRBC: 0 % (ref 0.0–0.2)

## 2024-04-01 MED ORDER — LABETALOL HCL 100 MG PO TABS
100.0000 mg | ORAL_TABLET | Freq: Once | ORAL | Status: AC
  Administered 2024-04-01: 100 mg via ORAL
  Filled 2024-04-01: qty 1

## 2024-04-01 MED ORDER — LABETALOL HCL 200 MG PO TABS
200.0000 mg | ORAL_TABLET | Freq: Two times a day (BID) | ORAL | Status: DC
  Administered 2024-04-01 – 2024-04-02 (×2): 200 mg via ORAL
  Filled 2024-04-01 (×2): qty 1

## 2024-04-01 NOTE — Progress Notes (Signed)
 [facility] Subjective:  Doing well postpartum day 1; tolerating regular diet, pain is controlled with PO medication, ambulating and voiding without difficulty. Reports breastfeeding is going well. Newborn has been having difficulty maintaining body temperature and is skin to skin with partner currently.   Objective:  Vital signs in last 24 hours: Temp:  [97.4 F (36.3 C)-98.4 F (36.9 C)] 98.4 F (36.9 C) (05/10 0831) Pulse Rate:  [94-106] 106 (05/10 1642) Resp:  [18-20] 18 (05/10 1642) BP: (127-151)/(75-83) 127/78 (05/10 1642) SpO2:  [96 %-99 %] 99 % (05/10 1642)    General: NAD Pulmonary: no increased work of breathing Abdomen: non-distended, non-tender, fundus firm at level of umbilicus Extremities: no edema, no erythema, no tenderness  Results for orders placed or performed during the hospital encounter of 03/30/24 (from the past 72 hours)  CBC     Status: Abnormal   Collection Time: 03/30/24 11:51 AM  Result Value Ref Range   WBC 11.4 (H) 4.0 - 10.5 K/uL   RBC 4.46 3.87 - 5.11 MIL/uL   Hemoglobin 13.0 12.0 - 15.0 g/dL   HCT 09.8 11.9 - 14.7 %   MCV 86.5 80.0 - 100.0 fL   MCH 29.1 26.0 - 34.0 pg   MCHC 33.7 30.0 - 36.0 g/dL   RDW 82.9 56.2 - 13.0 %   Platelets 206 150 - 400 K/uL   nRBC 0.0 0.0 - 0.2 %    Comment: Performed at [facility], [address] [place], [place] [address]  Type and screen [facility]     Status: None   Collection Time: 03/30/24 11:51 AM  Result Value Ref Range   ABO/RH(D) A POS    Antibody Screen NEG    Sample Expiration      04/02/2024,2359 Performed at [facility], [address] [place], [place] [address]   RPR     Status: None   Collection Time: 03/30/24 11:51 AM  Result Value Ref Range   RPR Ser Ql NON REACTIVE NON REACTIVE    Comment: Performed at [facility], [address], [place], [place] [address]  Comprehensive metabolic panel     Status: Abnormal   Collection Time:  03/30/24 11:51 AM  Result Value Ref Range   Sodium 137 135 - 145 mmol/L   Potassium 3.7 3.5 - 5.1 mmol/L   Chloride 107 98 - 111 mmol/L   CO2 21 (L) 22 - 32 mmol/L   Glucose, Bld 80 70 - 99 mg/dL    Comment: Glucose reference range applies only to samples taken after fasting for at least 8 hours.   BUN 7 6 - 20 mg/dL   Creatinine, Ser 1.32 0.44 - 1.00 mg/dL   Calcium 9.1 8.9 - 44.0 mg/dL   Total Protein 6.6 6.5 - 8.1 g/dL   Albumin 2.9 (L) 3.5 - 5.0 g/dL   AST 30 15 - 41 U/L   ALT 35 0 - 44 U/L   Alkaline Phosphatase 164 (H) 38 - 126 U/L   Total Bilirubin 0.4 0.0 - 1.2 mg/dL   GFR, Estimated >10 >27 mL/min    Comment: (NOTE) Calculated using the CKD-EPI Creatinine Equation (2021)    Anion gap 9 5 - 15    Comment: Performed at [facility], [address] [place], [place] [address]  ABO/Rh     Status: None   Collection Time: 03/30/24 12:59 PM  Result Value Ref Range   ABO/RH(D)  A POS Performed at [facility], [address] [place], [place] [address]   CBC     Status: Abnormal   Collection Time: 04/01/24  6:05 AM  Result Value Ref Range   WBC 17.7 (H) 4.0 - 10.5 K/uL   RBC 3.94 3.87 - 5.11 MIL/uL   Hemoglobin 11.9 (L) 12.0 - 15.0 g/dL   HCT 84.6 (L) 96.2 - 95.2 %   MCV 89.6 80.0 - 100.0 fL   MCH 30.2 26.0 - 34.0 pg   MCHC 33.7 30.0 - 36.0 g/dL   RDW 84.1 32.4 - 40.1 %   Platelets 197 150 - 400 K/uL   nRBC 0.0 0.0 - 0.2 %    Comment: Performed at [facility], [address] [place], [place] [address]    Assessment:   23 y.o. G1P1001 postpartum day # 1, lactating  Plan:    1) Acute blood loss anemia - hemodynamically stable and asymptomatic- not clinically significant for this admission - po ferrous sulfate  2) Blood Type --/--/A POS Performed at [facility], [address] [place], [place] [address]  [phone]) / Rubella 1.14 (11/15 1041) / Varicella Immune  3) TDAP status declines  4) Feeding plan  breastfeeding  5)  Education given regarding options for contraception, as well as compatibility with breast feeding if applicable.  Patient plans on Nexplanon for contraception.  6)  Hypertension: increase Labetalol  dose to 200 mg BID  7) Disposition: continue current care   Angelita Kendall, CNM [facility] 04/01/2024 4:58 PM

## 2024-04-01 NOTE — Anesthesia Post-op Follow-up Note (Signed)
  Anesthesia Pain Follow-up Note  Patient: Kathleen Wong  Day #: 1  Date of Follow-up: 04/01/2024 Time: 10:54 AM  Last Vitals:  Vitals:   04/01/24 0330 04/01/24 0831  BP:  (!) 140/75  Pulse:  (!) 101  Resp:  20  Temp: 36.5 C 36.9 C  SpO2:  98%    Level of Consciousness: alert  Pain: none   Side Effects:None  Catheter Site Exam:clean, dry     Plan: D/C from anesthesia care at surgeon's request  [name]

## 2024-04-01 NOTE — Lactation Note (Signed)
 This note was copied from a baby's chart. Lactation Consultation Note  Patient Name: Kathleen Wong [id number] Date: 04/01/2024 Age:22 hours Reason for consult: Follow-up assessment;Early term 37-38.6wks;Breastfeeding assistance;Mother's request   Maternal Data This is mom's 1st baby, SVD. Mom with CHTN with superimposed preeclampsia and obesity.  On follow-up visit this afternoon mom reports baby latched and breastfed at the right breast for 50 minutes.Per mom baby does not latch and feed at the left breast although she has offered the baby to feed from the left breast. Mom requested BF assistance at the next feeding. Has patient been taught Hand Expression?: Yes Does the patient have breastfeeding experience prior to this delivery?: No  Feeding Mother's Current Feeding Choice: Breast Milk Breastfeeding attempted at 6:15 pm. Baby would not latch and feed. Baby very sleepy despite using techniques to wake the baby. Mom to eat her dinner and LC would return in 30 minutes.  LC returned to room. Baby very sleepy. Per parents baby has not shown feeding cues. Baby unswaddled, diaper changed. Assisted mom position baby at the left breast.Baby sleepy at the breast and is not latching. Mom hand expressed drops of colostrum. Baby tasting some drops and would not maintain the latch. Hat and clothes removed and baby in diaper. Mom positioned baby at her right breast in cradle hold. Provided mom tips and strategies to maximize positioning and latch techniques. Baby did feed at mom's right breast, some swallows heard. Baby requiring tactile stimulation and encouragement to continue feeding at the breast. Care nurse at bedside. Assisted mom with positioning baby at the left breast in football hold. Baby would latch and take a few sucks detaching several times. Baby sleepy . Care nurse swaddled baby and hat back on baby. Mom holding baby. Mom to call care nurse for next feeding for breastfeeding assistance.     LATCH Score Latch: Repeated attempts needed to sustain latch, nipple held in mouth throughout feeding, stimulation needed to elicit sucking reflex.  Audible Swallowing: A few with stimulation  Type of Nipple: Everted at rest and after stimulation  Comfort (Breast/Nipple): Soft / non-tender  Hold (Positioning): Assistance needed to correctly position infant at breast and maintain latch.  LATCH Score: 7   Lactation Tools Discussed/Used  DEBP: set-up, cleaning of parts, breastmilk storage.  Interventions Interventions: Breast feeding basics reviewed;Assisted with latch;Breast massage;Hand express;Breast compression;Adjust position;Support pillows;Position options;Education Reviewed what to expect in first days when breastfeeding including 8-12 feeds in 24 hours, feeding cues, how to know the baby is getting enough, how to wake a sleepy baby and cluster feeding. Also, discussed with parents potential for inconsistent feeding related to early term birth. LC will set-up DEBP at mom's bedside if baby will not latch and feed at next feeding.  Discharge Pump: Personal  Consult Status Consult Status: Follow-up Date: 04/02/24 Follow-up type: In-patient  Update provided to care nurse.  Angelica Kemp 04/01/2024, 9:04 PM

## 2024-04-01 NOTE — Anesthesia Postprocedure Evaluation (Signed)
 Anesthesia Post Note  Patient: Kathleen Wong  Procedure(s) Performed: AN AD HOC LABOR EPIDURAL  Patient location during evaluation: Mother Baby Anesthesia Type: Epidural Level of consciousness: awake and alert Pain management: pain level controlled Vital Signs Assessment: post-procedure vital signs reviewed and stable Respiratory status: spontaneous breathing, nonlabored ventilation and respiratory function stable Cardiovascular status: stable Postop Assessment: no headache, no backache, adequate PO intake and able to ambulate Anesthetic complications: no   No notable events documented.   Last Vitals:  Vitals:   04/01/24 0330 04/01/24 0831  BP:  (!) 140/75  Pulse:  (!) 101  Resp:  20  Temp: 36.5 C 36.9 C  SpO2:  98%    Last Pain:  Vitals:   04/01/24 0831  TempSrc: Oral  PainSc:                  [name]

## 2024-04-02 MED ORDER — IBUPROFEN 600 MG PO TABS
600.0000 mg | ORAL_TABLET | Freq: Four times a day (QID) | ORAL | Status: DC | PRN
Start: 2024-04-02 — End: 2024-05-18

## 2024-04-02 MED ORDER — LABETALOL HCL 200 MG PO TABS
200.0000 mg | ORAL_TABLET | Freq: Two times a day (BID) | ORAL | 2 refills | Status: AC
Start: 2024-04-02 — End: ?

## 2024-04-02 MED ORDER — ACETAMINOPHEN 325 MG PO TABS: 650.0000 mg | ORAL_TABLET | ORAL | Status: DC | PRN

## 2024-04-02 NOTE — Lactation Note (Signed)
 This note was copied from a baby's chart. Lactation Consultation Note  Patient Name: Kathleen Wong Date: 04/02/2024 Age:22 hours Reason for consult: Primapara;Early term 37-38.6wks;Maternal discharge;Follow-up assessment   Maternal Data Has patient been taught Hand Expression?: Yes Does the patient have breastfeeding experience prior to this delivery?: No  Feeding Mother's Current Feeding Choice: Breast Milk    Lactation Tools Discussed/Used Tools: Bottle;Flanges;Pump (DEBP used following infant attempt to feed at each breast with [facility] education and demo of paced bottle feeding of expressed milk) Flange Size: 21 (21mm adjustment due to edema reduction from previous encounter) Breast pump type: Double-Electric Breast Pump [facility] DEBP) Pump Education: Setup, frequency, and cleaning;Milk Storage Reason for Pumping: Infant difficult to rouse/maintain latch Pumping frequency: 15 minutes every 3 hours or upon unsuccessful feeding at breast Pumped volume: 10 mL  Interventions Interventions: Breast feeding basics reviewed;Assisted with latch;Skin to skin;Breast massage;Hand express;Breast compression;Adjust position;Support pillows;Position options;Expressed milk;Hand pump;DEBP;Education;Pace feeding;CDC milk storage guidelines;CDC Guidelines for Breast Pump Cleaning  Discharge Discharge Education: Engorgement and breast care;Warning signs for feeding baby;Outpatient recommendation Pump: Manual (Given hand pump at discharge and education provided on how to use, feed, and store milk if needed. Encouraged MOB to verify benefits for DEBP.) WIC Program:  (Provided [facility] education and brochure) Discussed feeding frequency, milk removal and supply correlation, and infant stomach capacity as well as normative infant behavior and WTE for monitoring diaper output, early and late stage hunger cues, and typical onset of infant cluster feeding to influence milk maturation and regulate  maternal supply. Encouraged MOB to reach out to outpatient clinic in the event any additional changes in feeding plan occur or questions about feeding plan arise.   Consult Status Consult Status: Complete Date: 04/02/24 Follow-up type: In-patient    Bronson Canny 04/02/2024, 1:16 PM

## 2024-04-02 NOTE — Progress Notes (Signed)
 AVS instructions given to pt. All questions answered.

## 2024-04-02 NOTE — Discharge Instructions (Signed)

## 2024-04-04 ENCOUNTER — Other Ambulatory Visit: Payer: Self-pay

## 2024-04-04 ENCOUNTER — Encounter: Admitting: Obstetrics

## 2024-04-04 ENCOUNTER — Encounter: Payer: Self-pay | Admitting: Emergency Medicine

## 2024-04-04 ENCOUNTER — Emergency Department
Admission: EM | Admit: 2024-04-04 | Discharge: 2024-04-04 | Disposition: A | Discharge status: Home / Self Care | Attending: Emergency Medicine | Admitting: Emergency Medicine

## 2024-04-04 ENCOUNTER — Telehealth: Payer: Self-pay

## 2024-04-04 ENCOUNTER — Other Ambulatory Visit

## 2024-04-04 ENCOUNTER — Emergency Department

## 2024-04-04 DIAGNOSIS — M7989 Other specified soft tissue disorders: Secondary | ICD-10-CM | POA: Insufficient documentation

## 2024-04-04 DIAGNOSIS — R600 Localized edema: Secondary | ICD-10-CM | POA: Diagnosis present

## 2024-04-04 DIAGNOSIS — I10 Essential (primary) hypertension: Secondary | ICD-10-CM | POA: Diagnosis not present

## 2024-04-04 DIAGNOSIS — Z79899 Other long term (current) drug therapy: Secondary | ICD-10-CM | POA: Insufficient documentation

## 2024-04-04 DIAGNOSIS — R519 Headache, unspecified: Secondary | ICD-10-CM | POA: Diagnosis not present

## 2024-04-04 LAB — COMPREHENSIVE METABOLIC PANEL WITH GFR
ALT: 45 U/L — ABNORMAL HIGH (ref 0–44)
AST: 39 U/L (ref 15–41)
Albumin: 3.1 g/dL — ABNORMAL LOW (ref 3.5–5.0)
Alkaline Phosphatase: 126 U/L (ref 38–126)
Anion gap: 10 (ref 5–15)
BUN: 13 mg/dL (ref 6–20)
CO2: 21 mmol/L — ABNORMAL LOW (ref 22–32)
Calcium: 8.9 mg/dL (ref 8.9–10.3)
Chloride: 109 mmol/L (ref 98–111)
Creatinine, Ser: 0.63 mg/dL (ref 0.44–1.00)
GFR, Estimated: >60 mL/min (ref >60)
Glucose, Bld: 83 mg/dL (ref 70–99)
Potassium: 3.9 mmol/L (ref 3.5–5.1)
Sodium: 140 mmol/L (ref 135–145)
Total Bilirubin: 0.6 mg/dL (ref 0.0–1.2)
Total Protein: 6.6 g/dL (ref 6.5–8.1)

## 2024-04-04 LAB — CBC WITH DIFFERENTIAL/PLATELET
Abs Immature Granulocytes: 0.09 10*3/uL — ABNORMAL HIGH (ref 0.00–0.07)
Basophils Absolute: 0 10*3/uL (ref 0.0–0.1)
Basophils Relative: 0 %
Eosinophils Absolute: 0.3 10*3/uL (ref 0.0–0.5)
Eosinophils Relative: 3 %
HCT: 36.2 % (ref 36.0–46.0)
Hemoglobin: 11.8 g/dL — ABNORMAL LOW (ref 12.0–15.0)
Immature Granulocytes: 1 %
Lymphocytes Relative: 16 %
Lymphs Abs: 1.8 10*3/uL (ref 0.7–4.0)
MCH: 29.4 pg (ref 26.0–34.0)
MCHC: 32.6 g/dL (ref 30.0–36.0)
MCV: 90 fL (ref 80.0–100.0)
Monocytes Absolute: 0.7 10*3/uL (ref 0.1–1.0)
Monocytes Relative: 7 %
Neutro Abs: 8.4 10*3/uL — ABNORMAL HIGH (ref 1.7–7.7)
Neutrophils Relative %: 73 %
Platelets: 271 10*3/uL (ref 150–400)
RBC: 4.02 MIL/uL (ref 3.87–5.11)
RDW: 13.2 % (ref 11.5–15.5)
WBC: 11.3 10*3/uL — ABNORMAL HIGH (ref 4.0–10.5)
nRBC: 0 % (ref 0.0–0.2)

## 2024-04-04 LAB — URINALYSIS, ROUTINE W REFLEX MICROSCOPIC
Bilirubin Urine: NEGATIVE
Glucose, UA: NEGATIVE mg/dL
Ketones, ur: NEGATIVE mg/dL
Nitrite: NEGATIVE
Protein, ur: 30 mg/dL — AB
RBC / HPF: >50 RBC/hpf (ref 0–5)
Specific Gravity, Urine: 1.011 (ref 1.005–1.030)
WBC, UA: >50 WBC/hpf (ref 0–5)
pH: 8 (ref 5.0–8.0)

## 2024-04-04 MED ORDER — ACETAMINOPHEN 500 MG PO TABS
1000.0000 mg | ORAL_TABLET | Freq: Once | ORAL | Status: AC
  Administered 2024-04-04: 1000 mg via ORAL
  Filled 2024-04-04: qty 2

## 2024-04-04 MED ORDER — ACETAMINOPHEN 325 MG PO TABS: 650.0000 mg | ORAL_TABLET | Freq: Once | ORAL | Status: DC

## 2024-04-04 MED ORDER — FUROSEMIDE 20 MG PO TABS: 20.0000 mg | ORAL_TABLET | Freq: Every day | ORAL | 0 refills | Status: DC

## 2024-04-04 NOTE — ED Notes (Signed)
 Kathleen Wong (L+D) called back and stated the one of the midwives instructed that patient be seen through ED to evaluation for DVT and if BP continued to be HTN, to send to L+D after DVT evaluation.

## 2024-04-04 NOTE — ED Triage Notes (Signed)
 Patient to ED via POV for left leg pain/swelling. Started last night. Currently 4 days postpartum.

## 2024-04-04 NOTE — ED Notes (Signed)
 L+D called and notified of patient presentation and VS. Awaiting to hear if patient to go to L+D for evaluation.

## 2024-04-04 NOTE — Consult Note (Signed)
 Obstetrics Consult Note   SERVICE: Obstetrics   Patient Name: Kathleen Wong Patient MRN:   [id number]  CC: leg swelling & elevated blood pressure at 4 days postpartum  HPI: Kathleen Wong is a 23 y.o. G1P1001 now 4 days postpartum following vaginal delivery after induction of labor for pre-eclampsia. Endorses a mild headache, resolved without medication. Reports normal lochia without odor or clots. Noticed increased swelling to both legs, but left greater than right.     Review of Systems: positives in bold  GEN:   fevers, chills, weight changes, appetite changes, fatigue, night sweats HEENT:  mild HA, vision changes, hearing loss, congestion, rhinorrhea, sinus pressure, dysphagia CV:   CP, palpitations PULM:  SOB, cough GI:  abd pain, N/V/D/C GU:  dysuria, urgency, frequency MSK:  arthralgias, myalgias, back pain, swelling SKIN:  rashes, color changes, pallor NEURO:  numbness, weakness, tingling, seizures, dizziness, tremors PSYCH:  depression, anxiety, behavioral problems, confusion  HEME/LYMPH:  easy bruising or bleeding ENDO:  heat/cold intolerance  Past Obstetrical History: OB History     Gravida  1   Para  1   Term  1   Preterm  0   AB  0   Living  1      SAB  0   IAB  0   Ectopic  0   Multiple  0   Live Births  1           Past Gynecologic History: No LMP recorded.   Past Medical History: Past Medical History:  Diagnosis Date   Asthma    as a child   Hypertension     Past Surgical History:   Past Surgical History:  Procedure Laterality Date   NO PAST SURGERIES      Family History:  family history includes Autism in her maternal uncle; Healthy in her brother, father, maternal grandfather, maternal grandmother, mother, paternal grandfather, sister, and sister; Heart attack in her paternal grandmother.  Social History:  Social History   Socioeconomic History   Marital status: Single    Spouse name: Not on file   Number  of children: 0   Years of education: 13   Highest education level: Not on file  Occupational History   Occupation: unemployed  Tobacco Use   Smoking status: Never   Smokeless tobacco: Never  Vaping Use   Vaping status: Former   Quit date: 07/19/2023  Substance and Sexual Activity   Alcohol use: Not Currently   Drug use: Never   Sexual activity: Yes    Partners: Male    Birth control/protection: Implant  Other Topics Concern   Not on file  Social History Narrative   Not on file   Social Drivers of Health   Financial Resource Strain: Low Risk  (08/17/2023)   Overall Financial Resource Strain (CARDIA)    Difficulty of Paying Living Expenses: Not very hard  Food Insecurity: No Food Insecurity (03/30/2024)   Hunger Vital Sign    Worried About Running Out of Food in the Last Year: Never true    Ran Out of Food in the Last Year: Never true  Transportation Needs: No Transportation Needs (03/30/2024)   PRAPARE - Administrator, Civil Service (Medical): No    Lack of Transportation (Non-Medical): No  Physical Activity: Inactive (08/17/2023)   Exercise Vital Sign    Days of Exercise per Week: 0 days    Minutes of Exercise per Session: 0 min  Stress: No Stress Concern  Present (08/17/2023)   [employer] of Occupational Health - Occupational Stress Questionnaire    Feeling of Stress : Not at all  Social Connections: Moderately Isolated (08/17/2023)   Social Connection and Isolation Panel [NHANES]    Frequency of Communication with Friends and Family: More than three times a week    Frequency of Social Gatherings with Friends and Family: Twice a week    Attends Religious Services: Never    Database administrator or Organizations: No    Attends Banker Meetings: Never    Marital Status: Living with partner  Intimate Partner Violence: Patient Unable To Answer (04/01/2024)   Humiliation, Afraid, Rape, and Kick questionnaire    Fear of Current or Ex-Partner:  Patient unable to answer    Emotionally Abused: Patient unable to answer    Physically Abused: Patient unable to answer    Sexually Abused: Patient unable to answer    Home Medications:  Medications reconciled in EPIC  No current facility-administered medications on file prior to encounter.   Current Outpatient Medications on File Prior to Encounter  Medication Sig Dispense Refill   acetaminophen  (TYLENOL ) 325 MG tablet Take 2 tablets (650 mg total) by mouth every 4 (four) hours as needed (for pain scale < 4).     albuterol  (PROVENTIL ) (2.5 MG/3ML) 0.083% nebulizer solution Inhale 3 mLs (2.5 mg total) into the lungs every 4 (four) hours as needed for wheezing or shortness of breath. 75 mL 12   cetirizine (ZYRTEC) 10 MG chewable tablet Chew 10 mg by mouth daily.     ibuprofen (ADVIL) 600 MG tablet Take 1 tablet (600 mg total) by mouth every 6 (six) hours as needed.     labetalol  (NORMODYNE ) 200 MG tablet Take 1 tablet (200 mg total) by mouth 2 (two) times daily. 60 tablet 2   Prenatal Vit-Fe Fumarate-FA (MULTIVITAMIN-PRENATAL) 27-0.8 MG TABS tablet Take 1 tablet by mouth daily at 12 noon. 30 tablet 11    Allergies:  No Known Allergies  Physical Exam:  Temp:  [99.3 F (37.4 C)] 99.3 F (37.4 C) (05/13 1345) Pulse Rate:  [99-106] 99 (05/13 1757) Resp:  [16-17] 16 (05/13 1757) BP: (148-150)/(70-79) 148/70 (05/13 1757) SpO2:  [96 %] 96 % (05/13 1757) Weight:  [117.9 kg] 117.9 kg (05/13 1346)   General Appearance:  Well developed, well nourished, no acute distress, alert and oriented x3 Cardiovascular:  regular rate  Pulmonary:  no labored breathing Abdomen:  soft, nontender, nondistended, fundus nontender Extremities:  Full range of motion, 3+ pitting edema Skin:  normal coloration and turgor, no rashes, no suspicious skin lesions noted  Psychiatric:  Normal mood and affect, appropriate Pelvic:  Deferred   Labs/Studies:   CBC and Coags:  Lab Results  Component Value Date    WBC 11.3 (H) 04/04/2024   NEUTOPHILPCT 73 04/04/2024   EOSPCT 3 04/04/2024   BASOPCT 0 04/04/2024   LYMPHOPCT 16 04/04/2024   HGB 11.8 (L) 04/04/2024   HCT 36.2 04/04/2024   MCV 90.0 04/04/2024   PLT 271 04/04/2024   CMP:  Lab Results  Component Value Date   NA 140 04/04/2024   K 3.9 04/04/2024   CL 109 04/04/2024   CO2 21 (L) 04/04/2024   BUN 13 04/04/2024   CREATININE 0.63 04/04/2024   CREATININE 0.48 03/30/2024   CREATININE 0.55 (L) 03/29/2024   PROT 6.6 04/04/2024   BILITOT 0.6 04/04/2024   BILIDIR 0.10 02/24/2024   ALT 45 (H) 04/04/2024   AST  39 04/04/2024   ALKPHOS 126 04/04/2024    Other Imaging: US  Venous Img Lower Unilateral Left Result Date: 04/04/2024 CLINICAL DATA:  Swelling left lower extremity and pain since last night. Patient is 4 days postpartum EXAM: Left LOWER EXTREMITY VENOUS DOPPLER ULTRASOUND TECHNIQUE: Gray-scale sonography with graded compression, as well as color Doppler and duplex ultrasound were performed to evaluate the lower extremity deep venous systems from the level of the common femoral vein and including the common femoral, femoral, profunda femoral, popliteal and calf veins including the posterior tibial, peroneal and gastrocnemius veins when visible. The superficial great saphenous vein was also interrogated. Spectral Doppler was utilized to evaluate flow at rest and with distal augmentation maneuvers in the common femoral, femoral and popliteal veins. COMPARISON:  None Available. FINDINGS: Contralateral Common Femoral Vein: Respiratory phasicity is normal and symmetric with the symptomatic side. No evidence of thrombus. Normal compressibility. Common Femoral Vein: No evidence of thrombus. Normal compressibility, respiratory phasicity and response to augmentation. Saphenofemoral Junction: No evidence of thrombus. Normal compressibility and flow on color Doppler imaging. Profunda Femoral Vein: No evidence of thrombus. Normal compressibility and flow  on color Doppler imaging. Femoral Vein: No evidence of thrombus. Normal compressibility, respiratory phasicity and response to augmentation. Popliteal Vein: No evidence of thrombus. Normal compressibility, respiratory phasicity and response to augmentation. Calf Veins: No evidence of thrombus. Normal compressibility and flow on color Doppler imaging. Superficial Great Saphenous Vein: No evidence of thrombus. Normal compressibility. Venous Reflux:  None. Other Findings:  None. IMPRESSION: No evidence of left lower extremity DVT. Electronically Signed   By: Adrianna Horde M.D.   On: 04/04/2024 14:53   US  MFM FETAL BPP WO NON STRESS Result Date: 03/22/2024 ----------------------------------------------------------------------  OBSTETRICS REPORT                       (Signed Final 03/22/2024 04:15 pm) ---------------------------------------------------------------------- Patient Info  ID #:       [id number]                          D.O.B.:  August 05, 2001 (22 yrs)(F)  Name:       [name]                Visit Date: 03/22/2024 02:45 pm ---------------------------------------------------------------------- Performed By  Attending:        Jodeen Munch      Ref. Address:     Willaim Harlem                    MD                                                             [address]  [place]                                                             [address]  Performed By:     Elsie Halo BS      Location[facility]         [facility]                    [facility]                                 [facility] at                                                             [facility]  Referred By:      Angelita Kendall                    CNM ---------------------------------------------------------------------- Orders  #  Description                           Code        Ordered By  1  US   MFM FETAL BPP WO NON               76819.01    YU FANG     STRESS  2  US  MFM OB FOLLOW UP                   76816.01    YU FANG ----------------------------------------------------------------------  #  Order #                     Accession #                Episode #  1  [id number]                   [id number]                 [id number]  2  [id number]                   [id number]                 [id number] ---------------------------------------------------------------------- Indications  [redacted] weeks gestation of pregnancy                Z3A.36  Hypertension - Chronic/Pre-existing            O10.019  (labetalol )  Obesity complicating pregnancy, third          O99.213  trimester (pregravid BMI 38)  Encounter for other antenatal screening        Z36.2  follow-up  Neg MatT21 ---------------------------------------------------------------------- Fetal Evaluation  Num Of Fetuses:         1  Fetal Heart Rate(bpm):  150  Cardiac Activity:       Observed  Presentation:           Cephalic  Placenta:  Anterior  P. Cord Insertion:      Previously seen  Amniotic Fluid  AFI FV:      Within normal limits  AFI Sum(cm)     %Tile       Largest Pocket(cm)  9.3             18          3.06  RUQ(cm)       RLQ(cm)       LUQ(cm)        LLQ(cm)  2.68          1.74          1.82           3.06 ---------------------------------------------------------------------- Biophysical Evaluation  Amniotic F.V:   Within normal limits       F. Tone:        Observed  F. Movement:    Observed                   Score:          8/8  F. Breathing:   Observed ---------------------------------------------------------------------- Biometry  BPD:     89.39  mm     G. Age:  36w 1d         54  %    CI:        75.82   %    70 - 86                                                          FL/HC:      20.9   %    20.1 - 22.1  HC:    325.43   mm     G. Age:  36w 6d         31  %    HC/AC:      1.01        0.93 - 1.11  AC:    322.47   mm     G. Age:  36w 1d          54  %    FL/BPD:     76.0   %    71 - 87  FL:      67.93  mm     G. Age:  34w 6d         13  %    FL/AC:      21.1   %    20 - 24  LV:        2.7  mm  Est. FW:    2802  gm      6 lb 3 oz     39  % ---------------------------------------------------------------------- OB History  Gravidity:    1         Term:   0        Prem:   0        SAB:   0  TOP:          0       Ectopic:  0        Living: 0 ---------------------------------------------------------------------- Gestational Age  LMP:           38w 6d        Date:  06/24/23                   EDD:   03/30/24  U/S Today:     36w 0d                                        EDD:   04/19/24  Best:          36w 3d     Det. ByDelphine Fiedler         EDD:   04/16/24                                      (08/27/23) ---------------------------------------------------------------------- Anatomy  Ventricles:            Appears normal         Stomach:                Appears normal, left                                                                        sided  Heart:                 Appears normal         Kidneys:                Appear normal                         (4CH, axis, and                         situs)  Diaphragm:             Appears normal         Bladder:                Appears normal ---------------------------------------------------------------------- Cervix Uterus Adnexa  Cervix  Not visualized (advanced GA >24wks)  Uterus  No abnormality visualized.  Right Ovary  Not visualized.  Left Ovary  Not visualized.  Cul De Sac  No free fluid seen.  Adnexa  No abnormality visualized ---------------------------------------------------------------------- Impression  Follow up growth due to chronic hypertension on medication.  Normal interval growth with measurements consistent with  dates  Good fetal movement and amniotic fluid volume  Biophysical profile 8/8 ---------------------------------------------------------------------- Recommendations  Continue weekly testing  locally. ----------------------------------------------------------------------              Jodeen Munch, MD Electronically Signed Final Report   03/22/2024 04:15 pm ----------------------------------------------------------------------   US  MFM OB FOLLOW UP Result Date: 03/22/2024 ----------------------------------------------------------------------  OBSTETRICS REPORT                       (Signed Final 03/22/2024 04:15 pm) ---------------------------------------------------------------------- Patient Info  ID #:       [id number]                          D.O.B.:  Jan 01, 2001 (22  yrs)(F)  Name:       COTY BAUKNECHT                Visit Date: 03/22/2024 02:45 pm ---------------------------------------------------------------------- Performed By  Attending:        Jodeen Munch      Ref. Address:     Willaim Harlem                    MD                                                             [address]                                                             [place]                                                             [address]  Performed By:     Elsie Halo BS      Location:         [facility]                    RDMS RVT                                 Fetal Care at                                                             [facility]  Referred By:      Angelita Kendall                    CNM ---------------------------------------------------------------------- Orders  #  Description                           Code        Ordered By  1  US  MFM FETAL BPP WO NON               54098.11    YU FANG     STRESS  2  US  MFM OB FOLLOW UP  40981.19    Elgin Grit ----------------------------------------------------------------------  #  Order #                     Accession #                Episode #  1  [id number]                   [id number]                 [id number]  2  [id number]                    [id number]                 [id number] ---------------------------------------------------------------------- Indications  [redacted] weeks gestation of pregnancy                Z3A.36  Hypertension - Chronic/Pre-existing            O10.019  (labetalol )  Obesity complicating pregnancy, third          O99.213  trimester (pregravid BMI 38)  Encounter for other antenatal screening        Z36.2  follow-up  Neg MatT21 ---------------------------------------------------------------------- Fetal Evaluation  Num Of Fetuses:         1  Fetal Heart Rate(bpm):  150  Cardiac Activity:       Observed  Presentation:           Cephalic  Placenta:               Anterior  P. Cord Insertion:      Previously seen  Amniotic Fluid  AFI FV:      Within normal limits  AFI Sum(cm)     %Tile       Largest Pocket(cm)  9.3             18          3.06  RUQ(cm)       RLQ(cm)       LUQ(cm)        LLQ(cm)  2.68          1.74          1.82           3.06 ---------------------------------------------------------------------- Biophysical Evaluation  Amniotic F.V:   Within normal limits       F. Tone:        Observed  F. Movement:    Observed                   Score:          8/8  F. Breathing:   Observed ---------------------------------------------------------------------- Biometry  BPD:     89.39  mm     G. Age:  36w 1d         54  %    CI:        75.82   %    70 - 86                                                          FL/HC:      20.9   %    20.1 - 22.1  HC:    325.43  mm     G. Age:  36w 6d         31  %    HC/AC:      1.01        0.93 - 1.11  AC:    322.47   mm     G. Age:  36w 1d         54  %    FL/BPD:     76.0   %    71 - 87  FL:      67.93  mm     G. Age:  34w 6d         13  %    FL/AC:      21.1   %    20 - 24  LV:        2.7  mm  Est. FW:    2802  gm      6 lb 3 oz     39  % ---------------------------------------------------------------------- OB History  Gravidity:    1         Term:   0        Prem:   0        SAB:   0  TOP:          0        Ectopic:  0        Living: 0 ---------------------------------------------------------------------- Gestational Age  LMP:           38w 6d        Date:  06/24/23                   EDD:   03/30/24  U/S Today:     36w 0d                                        EDD:   04/19/24  Best:          36w 3d     Det. By:  Delphine Fiedler         EDD:   04/16/24                                      (08/27/23) ---------------------------------------------------------------------- Anatomy  Ventricles:            Appears normal         Stomach:                Appears normal, left                                                                        sided  Heart:                 Appears normal         Kidneys:                Appear normal                         (  4CH, axis, and                         situs)  Diaphragm:             Appears normal         Bladder:                Appears normal ---------------------------------------------------------------------- Cervix Uterus Adnexa  Cervix  Not visualized (advanced GA >24wks)  Uterus  No abnormality visualized.  Right Ovary  Not visualized.  Left Ovary  Not visualized.  Cul De Sac  No free fluid seen.  Adnexa  No abnormality visualized ---------------------------------------------------------------------- Impression  Follow up growth due to chronic hypertension on medication.  Normal interval growth with measurements consistent with  dates  Good fetal movement and amniotic fluid volume  Biophysical profile 8/8 ---------------------------------------------------------------------- Recommendations  Continue weekly testing locally. ----------------------------------------------------------------------              Jodeen Munch, MD Electronically Signed Final Report   03/22/2024 04:15 pm ----------------------------------------------------------------------   US  MFM FETAL BPP WO NON STRESS Result Date:  03/13/2024 ----------------------------------------------------------------------  OBSTETRICS REPORT                       (Signed Final 03/13/2024 03:33 pm) ---------------------------------------------------------------------- Patient Info  ID #:       [id number]                          D.O.B.:  10/30/2001 (22 yrs)(F)  Name:       [name]                Visit Date: 03/13/2024 02:45 pm ---------------------------------------------------------------------- Performed By  Attending:        [name] DO       Ref. Address:     [facility]                                                             [address]                                                             [place]                                                             [address]  Performed By:     Fredrick Jenkins          Location:  [facility]                    RDMS                                     Fetal Care at                                                             [facility]  Referred By:      Angelita Kendall                    CNM ---------------------------------------------------------------------- Orders  #  Description                           Code        Ordered By  1  US  MFM FETAL BPP WO NON               76819.01    YU FANG     STRESS ----------------------------------------------------------------------  #  Order #                     Accession #                Episode #  1  [id number]                   [id number]                 [id number] ---------------------------------------------------------------------- Indications  Hypertension - Chronic/Pre-existing            O10.019  (labetalol )  Obesity complicating pregnancy, third          O99.213  trimester (pregravid BMI 38)  [redacted] weeks gestation of pregnancy                Z3A.35  Encounter for other antenatal screening        Z36.2  follow-up  Neg MatT21  ---------------------------------------------------------------------- Fetal Evaluation  Num Of Fetuses:         1  Fetal Heart Rate(bpm):  148  Cardiac Activity:       Observed  Presentation:           Cephalic  Placenta:               Anterior  P. Cord Insertion:      Previously seen  Amniotic Fluid  AFI FV:      Within normal limits  AFI Sum(cm)     %Tile       Largest Pocket(cm)  5.83            < 3         3.57  RUQ(cm)       RLQ(cm)       LUQ(cm)        LLQ(cm)  5.03          2.92          2.63           3.57 ---------------------------------------------------------------------- Biophysical Evaluation  Amniotic F.V:   Pocket => 2 cm  F. Tone:        Observed  F. Movement:    Observed                   Score:          8/8  F. Breathing:   Observed ---------------------------------------------------------------------- OB History  Gravidity:    1         Term:   0        Prem:   0        SAB:   0  TOP:          0       Ectopic:  0        Living: 0 ---------------------------------------------------------------------- Gestational Age  LMP:           37w 4d        Date:  06/24/23                   EDD:   03/30/24  Best:          35w 1d     Det. By:  Delphine Fiedler         EDD:   04/16/24                                      (08/27/23) ---------------------------------------------------------------------- Anatomy  Diaphragm:             Appears normal         Kidneys:                Appear normal  Stomach:               Appears normal, left   Bladder:                Appears normal                         sided ---------------------------------------------------------------------- Comments  Tytionna F Netzley is doing well today with no acute  concerns.  RE CHTN: BP is controlled with 100 mg of Labetalol . She  denies headaches or visual disturbances. I discussed the  signs/symptoms of preeclampsia and delivery timing (likely  39 weeks) as well as standard OB precautions (fetal  movement, labor, etc). She will  continue to have antenatal  testing weekly. Discussed BP goal of < 140/90.  Sonographic findings  Single intrauterine pregnancy.  Fetal cardiac activity: Observed.  Presentation: Cephalic.  Interval fetal anatomy appears normal.  Amniotic fluid: Within normal limits.  MVP: 3.57 cm.  Placenta: Anterior.  BPP: 8/8.  There are limitations of prenatal ultrasound such as the  inability to detect certain abnormalities due to poor  visualization. Various factors such as fetal position,  gestational age and maternal body habitus may increase the  difficulty in visualizing the fetal anatomy.  Recommendations  -Serial growth ultrasounds every 4 weeks until delivery  -Weekly antenatal testing  -Delivery around [redacted] weeks gestation or sooner if indicated  -Standard OB precautions give  -BP goal < 140/90 ----------------------------------------------------------------------                  [name], DO Electronically Signed Final Report   03/13/2024 03:33 pm ----------------------------------------------------------------------   US  MFM FETAL BPP WO NON STRESS Result Date: 03/06/2024 ----------------------------------------------------------------------  OBSTETRICS REPORT                       (  Signed Final 03/06/2024 04:42 pm) ---------------------------------------------------------------------- Patient Info  ID #:       [id number]                          D.O.B.:  [birth year] (22 yrs)(F)  Name:       [name]                Visit Date: 03/06/2024 02:36 pm ---------------------------------------------------------------------- Performed By  Attending:        Sal Crass MD         Ref. Address:     [facility]                                                             [address]                                                             [place]                                                             [address]  Performed By:     Fredrick Jenkins          Location[facility]         [facility]                                     [facility]                                                             [facility]  Referred By:      JANE GLEDHILL                    CNM ---------------------------------------------------------------------- Orders  #  Description                           Code        Ordered By  1  US  MFM FETAL BPP WO NON  16109.60    YU FANG     STRESS ----------------------------------------------------------------------  #  Order #                     Accession #                Episode #  1  [id number]                   [id number]                 [id number] ---------------------------------------------------------------------- Indications  Hypertension - Chronic/Pre-existing            O10.019  (labetalol )  Obesity complicating pregnancy, third          O99.213  trimester (pregravid BMI 38)  [redacted] weeks gestation of pregnancy                Z3A.34  Encounter for other antenatal screening        Z36.2  follow-up  Neg MatT21 ---------------------------------------------------------------------- Fetal Evaluation  Num Of Fetuses:         1  Fetal Heart Rate(bpm):  136  Cardiac Activity:       Observed  Presentation:           Cephalic  Placenta:               Anterior  P. Cord Insertion:      Previously seen  Amniotic Fluid  AFI FV:      Within normal limits  AFI Sum(cm)     %Tile       Largest Pocket(cm)  11.51           30          5  RUQ(cm)       RLQ(cm)       LUQ(cm)        LLQ(cm)  0             2.73          5              3.78 ---------------------------------------------------------------------- Biophysical Evaluation  Amniotic F.V:   Pocket => 2 cm             F. Tone:        Observed  F. Movement:    Observed                   Score:          8/8  F. Breathing:   Observed ---------------------------------------------------------------------- OB History  Gravidity:    1         Term:   0         Prem:   0        SAB:   0  TOP:          0       Ectopic:  0        Living: 0 ---------------------------------------------------------------------- Gestational Age  LMP:           36w 4d        Date:  06/24/23                   EDD:   03/30/24  Best:          34w 1d     Det. ByDelphine Fiedler         EDD:   04/16/24                                      (  08/27/23) ---------------------------------------------------------------------- Anatomy  Diaphragm:             Appears normal         Kidneys:                Appear normal  Stomach:               Appears normal, left   Bladder:                Appears normal                         sided ---------------------------------------------------------------------- Comments  Airam Schlauch is currently at 33 weeks and 1 day.  She has  been followed due to chronic hypertension treated with  labetalol  and maternal obesity with a BMI of 38.  She denies any problems since her last exam.  Her blood  pressure today was 132/86.  There was normal amniotic fluid noted today with a total AFI  of 11.51 cm.  A BPP performed today was 8 out of 8.  Due to chronic hypertension, we will continue to follow her  with weekly fetal testing until delivery.  Should her blood pressures remain within normal limits,  delivery will be recommended at around 39 weeks.  Delivery prior to 39 weeks may be recommended should her  blood pressures be elevated despite treatment with labetalol   or should she develop preeclampsia.  She will return in 1 week for another BPP.  The patient stated that all of her questions were answered  today.  A total of 10 minutes was spent counseling and coordinating  the care for this patient.  Greater than 50% of the time was  spent in direct face-to-face contact. ----------------------------------------------------------------------                   Sal Crass, MD Electronically Signed Final Report   03/06/2024 04:42 pm  ----------------------------------------------------------------------     Assessment / Plan:   [name] is a 23 y.o. G1P1001 4 days postpartum with BLE edema. Blood pressures mild range, no severe range pressures, no severe symptoms. Start 5 day course of lasix 20mg , continue labetalol  at 200mg  bid. Blood pressure & mood check in clinic Thursday.  Thank you for the opportunity to be involved with this pt's care.

## 2024-04-04 NOTE — ED Provider Notes (Signed)
 [facility] Provider Note    Event Date/Time   First MD Initiated Contact with Patient 04/04/24 1520     (approximate)   History   Leg Swelling   HPI  Kathleen Wong is a 23 y.o. female G1P1001 who presents for evaluation of left leg swelling. Patient had a vaginal birth on 03/31/24, history of chronic hypertension with superimposed preeclampsia. Patient has had continued leg swelling bilaterally. She developed pain along the achilles last night and came tot he ED to rule out a DVT. She was prescribed labetalol  for her BP and has been compliant with this medication. She states she has a headache but denies blurry vision, RUQ abdominal pain, chest pain and SOB.  She is not sure if her BP is elevated because of her pre-eclampsia but states she is under a significant amount of stress at home. Endorses not feeling safe at home, but has friends in the room that she has been staying with.  Patient is also concerned about some labial swelling.       Physical Exam   Triage Vital Signs: ED Triage Vitals  Encounter Vitals Group     BP 04/04/24 1345 (!) 150/79     Systolic BP Percentile --      Diastolic BP Percentile --      Pulse Rate 04/04/24 1345 (!) 106     Resp 04/04/24 1345 17     Temp 04/04/24 1345 99.3 F (37.4 C)     Temp Source 04/04/24 1345 Oral     SpO2 04/04/24 1345 96 %     Weight 04/04/24 1346 260 lb (117.9 kg)     Height 04/04/24 1346 5\' 5"  (1.651 m)     Head Circumference --      Peak Flow --      Pain Score 04/04/24 1346 3     Pain Loc --      Pain Education --      Exclude from Growth Chart --     Most recent vital signs: Vitals:   04/04/24 1345  BP: (!) 150/79  Pulse: (!) 106  Resp: 17  Temp: 99.3 F (37.4 C)  SpO2: 96%   General: Awake, no distress.  CV:  Good peripheral perfusion. RRR. Resp:  Normal effort. CTAB. Abd:  No distention. Soft, no TTP. Other:  Bilateral lower leg swelling, non-pitting edema, dorsalis pedis  pulse is 2+ and regular bilaterally, no pain with passive dorsiflexion bilaterally, no pain with compression of the right calf, pain with compression of left leg over the achilles tendon.   ED Results / Procedures / Treatments   Labs (all labs ordered are listed, but only abnormal results are displayed) Labs Reviewed  COMPREHENSIVE METABOLIC PANEL WITH GFR - Abnormal; Notable for the following components:      Result Value   CO2 21 (*)    Albumin 3.1 (*)    ALT 45 (*)    All other components within normal limits  CBC WITH DIFFERENTIAL/PLATELET - Abnormal; Notable for the following components:   WBC 11.3 (*)    Hemoglobin 11.8 (*)    Neutro Abs 8.4 (*)    Abs Immature Granulocytes 0.09 (*)    All other components within normal limits  URINALYSIS, ROUTINE W REFLEX MICROSCOPIC - Abnormal; Notable for the following components:   Color, Urine YELLOW (*)    APPearance HAZY (*)    Hgb urine dipstick LARGE (*)    Protein, ur 30 (*)  Leukocytes,Ua MODERATE (*)    Bacteria, UA RARE (*)    All other components within normal limits     RADIOLOGY  Left leg US  obtained to evaluated for DVT, I interpreted the images as well as reviewed the radiologist report which was negative for any acute abnormalities.  PROCEDURES:  Critical Care performed: No  Procedures   MEDICATIONS ORDERED IN ED: Medications  acetaminophen  (TYLENOL ) tablet 1,000 mg (1,000 mg Oral Given 04/04/24 1636)     IMPRESSION / MDM / ASSESSMENT AND PLAN / ED COURSE  I reviewed the triage vital signs and the nursing notes.                             23 year old female presents for evaluation of post partum leg swelling with history of pre-eclampsia. Patient is hypertensive and tachycardic but NAD on exam.   Differential diagnosis includes, but is not limited to, worsening pre-eclampsia, HELLP, DVT, muscle strain.  Patient's presentation is most consistent with acute complicated illness / injury requiring  diagnostic workup.  Will obtain basic labs to evaluate for pre-eclampsia and HELLP. Consulted OB for management who came to see the patient while in the ED.   Per OB, if lab work is ok she will be discharged with Lasix and will have a BP follow up in a few days.  Patient was given tylenol  for her headache.  Clinical Course as of 04/04/24 1747  Tue Apr 04, 2024  1705 CBC with Differential(!) Unremarkable, at baseline from a few days ago. No hemolysis or low platelets. [LD]  1707 Comprehensive metabolic panel(!) Unremarkable, no elevated LFTs. [LD]  1708 Patient states her headache is improving after the tylenol . [LD]  1729 Urinalysis, Routine w reflex microscopic -Urine, Clean Catch(!) Patient is bleeding so not concerned about presence of leukocytes, WBCs and bacteria likely from a dirty catch. Does have protein in the urine but would expect this given she is bleeding. [LD]  1743 Reviewed results of lab work with [facility] provider who gave the ok for discharge. [LD]    Clinical Course User Index [LD] Phyliss Breen, PA-C     FINAL CLINICAL IMPRESSION(S) / ED DIAGNOSES   Final diagnoses:  Bilateral lower extremity edema     Rx / DC Orders   ED Discharge Orders          Ordered    furosemide (LASIX) 20 MG tablet  Daily        04/04/24 1700             Note:  This document was prepared using Dragon voice recognition software and may include unintentional dictation errors.   Phyliss Breen, PA-C 04/04/24 1747    Twilla Galea, MD 04/04/24 [phone]

## 2024-04-04 NOTE — Discharge Instructions (Addendum)
 Please take the lasix as prescribed. Follow up with your OB as directed.   Return to the ED if you have any worsening symptoms or new pain in your legs.

## 2024-04-04 NOTE — Telephone Encounter (Signed)
 TRIAGE VOICEMAIL: Patient states she gave birth four days ago. She was advised to keep an eye on the swelling of her legs and feet for the possibility of DVT because she had pre-eclampsia. She looked today and they're starting to swell more and she's starting to have pain in her calf and it's starting to go red. Inquiring if she needs to be seen at the office or go to the ER.

## 2024-04-04 NOTE — Telephone Encounter (Signed)
 Chart reviewed. ED visit noted in chart. Left voicemail advising it appears she is in ED which is where she should be. Patient advised to return call for any additional questions.

## 2024-04-06 ENCOUNTER — Ambulatory Visit (INDEPENDENT_AMBULATORY_CARE_PROVIDER_SITE_OTHER): Admitting: Certified Nurse Midwife

## 2024-04-06 ENCOUNTER — Ambulatory Visit: Admitting: Certified Nurse Midwife

## 2024-04-06 DIAGNOSIS — O10913 Unspecified pre-existing hypertension complicating pregnancy, third trimester: Secondary | ICD-10-CM

## 2024-04-06 NOTE — Progress Notes (Signed)
    Post Partum Visit Note  Kathleen Wong is a 23 y.o. G64P1001 female who presents for a postpartum visit. She is 1 week postpartum following a normal spontaneous vaginal delivery.  I have fully reviewed the prenatal and intrapartum course. The delivery was at 37+5 gestational weeks.  Anesthesia: epidural. Postpartum course has been complicated by elevated blood pressure & swelling. Baby boy Myrtie Atkinson is doing well. Baby is feeding by breast. Bleeding light, no clots or odor. Bowel function is normal. Bladder function is normal. Patient is not sexually active. Contraception method is abstinence.   Here for 1 week blood pressure check after ED visit earlier this week. Has taken lasix & labetalol  as prescribed. Denies headache, doubled/blurred vision or abdominal pain.    Health Maintenance Due  Topic Date Due   HPV VACCINES (1 - 3-dose series) Never done   Meningococcal B Vaccine (1 of 2 - Standard) Never done   DTaP/Tdap/Td (1 - Tdap) Never done   COVID-19 Vaccine (1 - 2024-25 season) Never done    The following portions of the patient's history were reviewed and updated as appropriate: allergies, current medications, past family history, past medical history, past social history, past surgical history, and problem list.  Review of Systems Pertinent items are noted in HPI.  Objective:  BP 129/60   Pulse 85   Ht 5\' 5"  (1.651 m)   Wt 243 lb (110.2 kg)   Breastfeeding Yes   BMI 40.44 kg/m    General:  alert, cooperative, appears stated age, and no distress  Lungs: Normal work of breathing  Heart:  Regular rate; swelling improving to BLE now 2+  Abdomen: Soft, nontender, fundus 3FB below U   GU exam:  Lochia small, labia without swelling       Assessment/Plam:    1. Routine postpartum follow-up (Primary)  2. Chronic hypertension complicating or reason for care during pregnancy, third trimester  Continue routine postpartum care. Complete lasix course as prescribed. Continue  labetalol  at 200mg  po bid. Follow up in 2w and again at 6w postpartum. Forestine Igo, CNM [facility], [facility]

## 2024-04-18 ENCOUNTER — Ambulatory Visit (INDEPENDENT_AMBULATORY_CARE_PROVIDER_SITE_OTHER): Admitting: Certified Nurse Midwife

## 2024-04-18 ENCOUNTER — Encounter: Payer: Self-pay | Admitting: Certified Nurse Midwife

## 2024-04-18 DIAGNOSIS — O10913 Unspecified pre-existing hypertension complicating pregnancy, third trimester: Secondary | ICD-10-CM

## 2024-04-18 DIAGNOSIS — Z1332 Encounter for screening for maternal depression: Secondary | ICD-10-CM

## 2024-04-18 NOTE — Progress Notes (Unsigned)
 Post Partum Visit Note  FREJA FARO is a 23 y.o. G40P1001 female who presents for a postpartum visit. She is {1-10:13787} {time; units:18646} postpartum following a {method of delivery:313099}.  I have fully reviewed the prenatal and intrapartum course. The delivery was at *** gestational weeks.  Anesthesia: {anesthesia types:812}. Postpartum course has been ***. Baby is doing well***. Baby is feeding by {breastmilk/bottle:69}. Bleeding {vag bleed:12292}. Bowel function is {normal:32111}. Bladder function is {normal:32111}. Patient {is/is not:9024} sexually active. Contraception method is {contraceptive method:5051}. Postpartum depression screening: {gen negative/positive:315881}.   Upstream - 04/18/24 1043       Contraception Wrap Up   Current Method Abstinence    End Method Abstinence    Contraception Counseling Provided Yes    How was the end contraceptive method provided? N/A            The pregnancy intention screening data noted above was reviewed. Potential methods of contraception were discussed. The patient elected to proceed with Abstinence.   Edinburgh Postnatal Depression Scale - 04/18/24 1439       Edinburgh Postnatal Depression Scale:  In the Past 7 Days   I have been able to laugh and see the funny side of things. 0    I have looked forward with enjoyment to things. 0    I have blamed myself unnecessarily when things went wrong. 1    I have been anxious or worried for no good reason. 1    I have felt scared or panicky for no good reason. 2    Things have been getting on top of me. 1    I have been so unhappy that I have had difficulty sleeping. 0    I have felt sad or miserable. 0    I have been so unhappy that I have been crying. 0    The thought of harming myself has occurred to me. 0    Edinburgh Postnatal Depression Scale Total 5             Health Maintenance Due  Topic Date Due   HPV VACCINES (1 - 3-dose series) Never done   Meningococcal B  Vaccine (1 of 2 - Standard) Never done   DTaP/Tdap/Td (1 - Tdap) Never done   COVID-19 Vaccine (1 - 2024-25 season) Never done    {Common ambulatory SmartLinks:19316}  Review of Systems {ros; complete:30496}  Objective:  BP 122/81   Pulse 88   Wt 234 lb (106.1 kg)   Breastfeeding Yes   BMI 38.94 kg/m    General:  {gen appearance:16600}   Breasts:  {desc; normal/abnormal/not indicated:14647}  Lungs: {lung exam:16931}  Heart:  {heart exam:5510}  Abdomen: {abdomen exam:16834}   Wound {Wound assessment:11097}  GU exam:  {desc; normal/abnormal/not indicated:14647}       Assessment:    There are no diagnoses linked to this encounter.  *** postpartum exam.   Plan:   Essential components of care per ACOG recommendations:  1.  Mood and well being: Patient with {gen negative/positive:315881} depression screening today. Reviewed local resources for support.  - Patient tobacco use? {tobacco use:25506}  - hx of drug use? {yes/no:25505}    2. Infant care and feeding:  -Patient currently breastmilk feeding? {yes/no:25502}  -Social determinants of health (SDOH) reviewed in EPIC. No concerns***The following needs were identified***  3. Sexuality, contraception and birth spacing - Patient {DOES_DOES AVW:09811} want a pregnancy in the next year.  Desired family size is {NUMBER 1-10:22536} children.  - Reviewed reproductive  life planning. Reviewed contraceptive methods based on pt preferences and effectiveness.  Patient desired {Upstream End Methods:24109} today.   - Discussed birth spacing of 18 months  4. Sleep and fatigue -Encouraged family/partner/community support of 4 hrs of uninterrupted sleep to help with mood and fatigue  5. Physical Recovery  - Discussed patients delivery and complications. She describes her labor as {description:25511} - Patient had a {CHL AMB [phone]}. Patient had a {laceration:25518} laceration. Perineal healing reviewed. Patient expressed  understanding - Patient has urinary incontinence? {yes/no:25515} - Patient {ACTION; IS/IS WUJ:81191478} safe to resume physical and sexual activity  6.  Health Maintenance - HM due items addressed {Yes or If no, why not?:20788} - Last pap smear  Diagnosis  Date Value Ref Range Status  10/08/2023   Final   - Negative for intraepithelial lesion or malignancy (NILM)   Pap smear {done:10129} at today's visit.  -Breast Cancer screening indicated? {indicated:25516}  7. Chronic Disease/Pregnancy Condition follow up: {Follow up:25499}  - PCP follow up  Forestine Igo, CNM [facility], [facility]

## 2024-04-19 ENCOUNTER — Encounter: Payer: Self-pay | Admitting: Certified Nurse Midwife

## 2024-05-02 ENCOUNTER — Encounter: Payer: Self-pay | Admitting: Certified Nurse Midwife

## 2024-05-18 ENCOUNTER — Encounter: Payer: Self-pay | Admitting: Certified Nurse Midwife

## 2024-05-18 ENCOUNTER — Ambulatory Visit: Admitting: Certified Nurse Midwife

## 2024-05-18 DIAGNOSIS — Z1332 Encounter for screening for maternal depression: Secondary | ICD-10-CM

## 2024-05-18 DIAGNOSIS — Z124 Encounter for screening for malignant neoplasm of cervix: Secondary | ICD-10-CM

## 2024-05-18 NOTE — Progress Notes (Signed)
 Post Partum Visit Note  Kathleen Wong is a 23 y.o. G63P1001 female who presents for a postpartum visit. She is 6 weeks postpartum following a normal spontaneous vaginal delivery.  I have fully reviewed the prenatal and intrapartum course. The delivery was at 37+5 gestational weeks.  Anesthesia: epidural. Postpartum course has been complicated by hypertension, she has not been taking labetalol  consistently recently. Baby boy Kathleen Wong is doing well. Baby is feeding by both breast and bottle - starting to supplement with formula before return to work. Bleeding stopped for several days then returned, unsure if period or continued postpartum bleeding, denies clots or foul odor, denies cramping. Bowel function is normal. Bladder function is normal. Patient is not sexually active. Contraception method is abstinence. Postpartum depression screening: positive-she reports significant stress due to relationship with family of Kathleen Wong (his mother) and Kathleen Wong has not been significantly involved in caring for baby. She is living with her father & step-mother and reports feeling well supported by them. She had been also staying with her mother, however has had increased stress r/t her mother's wife and is no longer staying at their home. She denies concerns about her mood and while she is stressed she feels she is coping.   Upstream - 05/18/24 1559       Pregnancy Intention Screening   Does the patient want to become pregnant in the next year? No    Does the patient's partner want to become pregnant in the next year? No    Would the patient like to discuss contraceptive options today? No      Contraception Wrap Up   Current Method Abstinence    Contraception Counseling Provided No    How was the end contraceptive method provided? N/A         The pregnancy intention screening data noted above was reviewed. Potential methods of contraception were discussed. The patient elected to proceed with No data recorded.    Edinburgh Postnatal Depression Scale - 05/18/24 1604       Edinburgh Postnatal Depression Scale:  In the Past 7 Days   I have been able to laugh and see the funny side of things. 1    I have looked forward with enjoyment to things. 0    I have blamed myself unnecessarily when things went wrong. 2    I have been anxious or worried for no good reason. 2    I have felt scared or panicky for no good reason. 2    Things have been getting on top of me. 2    I have been so unhappy that I have had difficulty sleeping. 1    I have felt sad or miserable. 1    I have been so unhappy that I have been crying. 1    The thought of harming myself has occurred to me. 0    Edinburgh Postnatal Depression Scale Total 12          Health Maintenance Due  Topic Date Due   HPV VACCINES (1 - 3-dose series) Never done   Meningococcal B Vaccine (1 of 2 - Standard) Never done   DTaP/Tdap/Td (1 - Tdap) Never done   Hepatitis B Vaccines (1 of 3 - 19+ 3-dose series) Never done   COVID-19 Vaccine (1 - 2024-25 season) Never done    The following portions of the patient's history were reviewed and updated as appropriate: allergies, current medications, past family history, past medical history, past social history,  past surgical history, and problem list.  Review of Systems Pertinent items are noted in HPI.  Objective:  BP 127/76   Pulse 98   Ht 5' 5 (1.651 m)   Wt 235 lb 1.6 oz (106.6 kg)   BMI 39.12 kg/m    General:  alert, cooperative, appears stated age, and no distress   Breasts:  normal  Lungs: clear to auscultation bilaterally  Heart:  regular rate and rhythm, S1, S2 normal, no murmur, click, rub or gallop  Abdomen: soft, non-tender; bowel sounds normal; no masses,  no organomegaly   GU exam:  NEFG. Vagina pink, well rugated, cervix multiparous, moderate amount blood in vaginal vault and noted at cervical os, negative CMT, uterus involuted, mobile & non-tender       Assessment:   Routine  postpartum follow-up  Encounter for screening for maternal depression  Postpartum care and examination of lactating mother  Plan:   Essential components of care per ACOG recommendations:  1.  Mood and well being: Patient with positive depression screening today. Reviewed local resources for support.  - Patient tobacco use? No.   - hx of drug use? No.    2. Infant care and feeding:  -Patient currently breastmilk feeding? Yes. Discussed returning to work and pumping.  -Social determinants of health (SDOH) reviewed in EPIC. No concerns  3. Sexuality, contraception and birth spacing - Patient does not want a pregnancy in the next year.  Desired family size is 1 child for the next few years.  - Reviewed reproductive life planning. Reviewed contraceptive methods based on pt preferences and effectiveness.  Patient desired Abstinence today.   - Discussed birth spacing of 18 months  4. Sleep and fatigue -Encouraged family/partner/community support of 4 hrs of uninterrupted sleep to help with mood and fatigue  5. Physical Recovery  - Discussed patients delivery and complications. She describes her labor as good. - Patient had a Vaginal, no problems at delivery. Patient had no laceration. Perineal healing reviewed. Patient expressed understanding - Patient has urinary incontinence? No. - Patient is safe to resume physical and sexual activity  6.  Health Maintenance - HM due items addressed Yes - Last pap smear  Diagnosis  Date Value Ref Range Status  10/08/2023   Final   - Negative for intraepithelial lesion or malignancy (NILM)   Pap smear not done at today's visit.  -Breast Cancer screening indicated? No.   7. Chronic Disease/Pregnancy Condition follow up: Hypertension-stop Labetalol  given normotensive today & not taking consistently.  - PCP follow up  Kathleen Wong Cisco, CNM [facility], [facility]
